# Patient Record
Sex: Female | Born: 1990
Health system: Southern US, Community
[De-identification: ages and names within clinical notes are randomized; demographics above are authoritative.]

## PROBLEM LIST (undated history)

## (undated) ENCOUNTER — Inpatient Hospital Stay (HOSPITAL_COMMUNITY): Payer: Self-pay

## (undated) DIAGNOSIS — F419 Anxiety disorder, unspecified: Secondary | ICD-10-CM

## (undated) DIAGNOSIS — Z789 Other specified health status: Secondary | ICD-10-CM

## (undated) DIAGNOSIS — M549 Dorsalgia, unspecified: Secondary | ICD-10-CM

## (undated) HISTORY — PX: CARPAL TUNNEL RELEASE: SHX101

---

## 2010-01-15 NOTE — L&D Delivery Note (Signed)
Patient was C/C/+1 and pushed for approx. 15 minutes without epidural.   NSVD  female infant, Apgars 8/9, weight 6#5.   The patient had minor labial lacerations, L side repaired with 2-0 vicryl. Fundus was firm. EBL was expected. Placenta was delivered intact. Vagina was clear.  Baby was vigorous to bedside.  Philip Aspen

## 2010-12-19 ENCOUNTER — Inpatient Hospital Stay (HOSPITAL_COMMUNITY)
Admission: EM | Admit: 2010-12-19 | Discharge: 2010-12-21 | DRG: 373 | Disposition: A | Discharge status: Home / Self Care | Payer: Federal, State, Local not specified - PPO | Attending: Obstetrics and Gynecology | Admitting: Obstetrics and Gynecology

## 2010-12-19 DIAGNOSIS — IMO0001 Reserved for inherently not codable concepts without codable children: Secondary | ICD-10-CM

## 2010-12-19 HISTORY — DX: Other specified health status: Z78.9

## 2010-12-19 LAB — RAPID HIV SCREEN (WH-MAU): Rapid HIV Screen: NONREACTIVE

## 2010-12-19 LAB — ABO/RH: ABO/RH(D): A POS

## 2010-12-19 LAB — CBC
HCT: 33.3 % — ABNORMAL LOW (ref 36.0–46.0)
Hemoglobin: 11.4 g/dL — ABNORMAL LOW (ref 12.0–15.0)
MCHC: 34.2 g/dL (ref 30.0–36.0)

## 2010-12-19 LAB — RUBELLA SCREEN: Rubella: >500 IU/mL — ABNORMAL HIGH

## 2010-12-19 LAB — TYPE AND SCREEN: ABO/RH(D): A POS

## 2010-12-19 MED ORDER — DIPHENHYDRAMINE HCL 25 MG PO CAPS: 25.0000 mg | ORAL_CAPSULE | Freq: Four times a day (QID) | ORAL | Status: DC | PRN

## 2010-12-19 MED ORDER — CALCIUM CARBONATE ANTACID 500 MG PO CHEW: 1.0000 | CHEWABLE_TABLET | Freq: Every day | ORAL | Status: DC | PRN

## 2010-12-19 MED ORDER — LANOLIN HYDROUS EX OINT: TOPICAL_OINTMENT | CUTANEOUS | Status: DC | PRN

## 2010-12-19 MED ORDER — ZOLPIDEM TARTRATE 5 MG PO TABS: 5.0000 mg | ORAL_TABLET | Freq: Every evening | ORAL | Status: DC | PRN

## 2010-12-19 MED ORDER — ACETAMINOPHEN 325 MG PO TABS: 650.0000 mg | ORAL_TABLET | ORAL | Status: DC | PRN

## 2010-12-19 MED ORDER — ONDANSETRON HCL 4 MG/2ML IJ SOLN
INTRAMUSCULAR | Status: AC
  Administered 2010-12-19: 05:00:00
  Filled 2010-12-19: qty 2

## 2010-12-19 MED ORDER — CITRIC ACID-SODIUM CITRATE 334-500 MG/5ML PO SOLN: 30.0000 mL | ORAL | Status: DC | PRN

## 2010-12-19 MED ORDER — DIPHENHYDRAMINE HCL 50 MG/ML IJ SOLN: 12.5000 mg | INTRAMUSCULAR | Status: DC | PRN

## 2010-12-19 MED ORDER — EPHEDRINE 5 MG/ML INJ: 10.0000 mg | INTRAVENOUS | Status: DC | PRN

## 2010-12-19 MED ORDER — ONDANSETRON HCL 4 MG PO TABS: 4.0000 mg | ORAL_TABLET | ORAL | Status: DC | PRN

## 2010-12-19 MED ORDER — OXYCODONE-ACETAMINOPHEN 5-325 MG PO TABS: 1.0000 | ORAL_TABLET | ORAL | Status: DC | PRN

## 2010-12-19 MED ORDER — ACETAMINOPHEN 325 MG PO TABS: 650.0000 mg | ORAL_TABLET | Freq: Four times a day (QID) | ORAL | Status: DC | PRN

## 2010-12-19 MED ORDER — PRENATAL PLUS 27-1 MG PO TABS
1.0000 | ORAL_TABLET | Freq: Every day | ORAL | Status: DC
  Filled 2010-12-19 (×2): qty 1

## 2010-12-19 MED ORDER — PHENYLEPHRINE 40 MCG/ML (10ML) SYRINGE FOR IV PUSH (FOR BLOOD PRESSURE SUPPORT): 80.0000 ug | PREFILLED_SYRINGE | INTRAVENOUS | Status: DC | PRN

## 2010-12-19 MED ORDER — SENNOSIDES-DOCUSATE SODIUM 8.6-50 MG PO TABS
2.0000 | ORAL_TABLET | Freq: Every day | ORAL | Status: DC
  Administered 2010-12-19: 2 via ORAL

## 2010-12-19 MED ORDER — WITCH HAZEL-GLYCERIN EX PADS: 1.0000 "application " | MEDICATED_PAD | CUTANEOUS | Status: DC | PRN

## 2010-12-19 MED ORDER — LIDOCAINE HCL (PF) 1 % IJ SOLN
30.0000 mL | INTRAMUSCULAR | Status: DC | PRN
  Administered 2010-12-19: 30 mL via SUBCUTANEOUS
  Filled 2010-12-19: qty 30

## 2010-12-19 MED ORDER — OXYTOCIN BOLUS FROM INFUSION
500.0000 mL | Freq: Once | INTRAVENOUS | Status: DC
  Filled 2010-12-19: qty 1000
  Filled 2010-12-19: qty 500

## 2010-12-19 MED ORDER — LACTATED RINGERS IV SOLN: 500.0000 mL | INTRAVENOUS | Status: DC | PRN

## 2010-12-19 MED ORDER — OXYCODONE-ACETAMINOPHEN 5-325 MG PO TABS
2.0000 | ORAL_TABLET | ORAL | Status: DC | PRN
  Administered 2010-12-19: 2 via ORAL
  Filled 2010-12-19: qty 2

## 2010-12-19 MED ORDER — IBUPROFEN 600 MG PO TABS: 600.0000 mg | ORAL_TABLET | Freq: Four times a day (QID) | ORAL | Status: DC | PRN

## 2010-12-19 MED ORDER — LACTATED RINGERS IV SOLN
INTRAVENOUS | Status: DC
  Administered 2010-12-19: 125 mL/h via INTRAVENOUS

## 2010-12-19 MED ORDER — TETANUS-DIPHTH-ACELL PERTUSSIS 5-2.5-18.5 LF-MCG/0.5 IM SUSP
0.5000 mL | Freq: Once | INTRAMUSCULAR | Status: DC
  Filled 2010-12-19: qty 0.5

## 2010-12-19 MED ORDER — PRENATAL PLUS 27-1 MG PO TABS
1.0000 | ORAL_TABLET | Freq: Every day | ORAL | Status: DC
  Administered 2010-12-19 – 2010-12-21 (×3): 1 via ORAL
  Filled 2010-12-19: qty 1

## 2010-12-19 MED ORDER — LACTATED RINGERS IV SOLN
500.0000 mL | Freq: Once | INTRAVENOUS | Status: AC
  Administered 2010-12-19: 500 mL via INTRAVENOUS

## 2010-12-19 MED ORDER — SIMETHICONE 80 MG PO CHEW: 80.0000 mg | CHEWABLE_TABLET | ORAL | Status: DC | PRN

## 2010-12-19 MED ORDER — ONDANSETRON HCL 4 MG/2ML IJ SOLN: 4.0000 mg | Freq: Four times a day (QID) | INTRAMUSCULAR | Status: DC | PRN

## 2010-12-19 MED ORDER — EPHEDRINE 5 MG/ML INJ
10.0000 mg | INTRAVENOUS | Status: DC | PRN
  Filled 2010-12-19: qty 4

## 2010-12-19 MED ORDER — PHENYLEPHRINE 40 MCG/ML (10ML) SYRINGE FOR IV PUSH (FOR BLOOD PRESSURE SUPPORT)
80.0000 ug | PREFILLED_SYRINGE | INTRAVENOUS | Status: DC | PRN
  Filled 2010-12-19: qty 5

## 2010-12-19 MED ORDER — DIBUCAINE 1 % RE OINT: 1.0000 "application " | TOPICAL_OINTMENT | RECTAL | Status: DC | PRN

## 2010-12-19 MED ORDER — IBUPROFEN 600 MG PO TABS
600.0000 mg | ORAL_TABLET | Freq: Four times a day (QID) | ORAL | Status: DC
  Administered 2010-12-19 – 2010-12-21 (×7): 600 mg via ORAL
  Filled 2010-12-19 (×8): qty 1

## 2010-12-19 MED ORDER — FLEET ENEMA 7-19 GM/118ML RE ENEM: 1.0000 | ENEMA | RECTAL | Status: DC | PRN

## 2010-12-19 MED ORDER — BENZOCAINE-MENTHOL 20-0.5 % EX AERO: 1.0000 "application " | INHALATION_SPRAY | CUTANEOUS | Status: DC | PRN

## 2010-12-19 MED ORDER — LACTATED RINGERS IV SOLN: INTRAVENOUS | Status: DC

## 2010-12-19 MED ORDER — FENTANYL 2.5 MCG/ML BUPIVACAINE 1/10 % EPIDURAL INFUSION (WH - ANES)
14.0000 mL/h | INTRAMUSCULAR | Status: DC
  Filled 2010-12-19: qty 60

## 2010-12-19 MED ORDER — OXYTOCIN 20 UNITS IN LACTATED RINGERS INFUSION - SIMPLE
125.0000 mL/h | Freq: Once | INTRAVENOUS | Status: AC
  Administered 2010-12-19: 999 mL/h via INTRAVENOUS

## 2010-12-19 MED ORDER — ONDANSETRON HCL 4 MG/2ML IJ SOLN: 4.0000 mg | INTRAMUSCULAR | Status: DC | PRN

## 2010-12-19 NOTE — ED Notes (Signed)
Pt from home with c/o labor. Pt states that she thought she was in false labor until about 30 min ago when she went to the bathroom and noticed she was bleeding. This is pts first pregnancy. Pt states contractions started in her back and have not moved to her abdomen. Pt is unable to give frequency. FHT 145.

## 2010-12-19 NOTE — Progress Notes (Signed)
PSYCHOSOCIAL ASSESSMENT ~ MATERNAL/CHILD Name: Susan Stanton                                                                         Age: 20  Referral Date:       30 /  04 / 12  Reason/Source: NPNC / CN  I. FAMILY/HOME ENVIRONMENT A. Child's Legal Guardian _X__Parent(s) ___Grandparent ___Foster parent ___DSS_________________ Name: Jones Skene                               DOB: //                     Age: 38  Address: [address] ; [place], [place] [address]  Name:   Mikeal Hawthorne                              DOB: //                     Age: 78  Address: ( same as above)  B. Other Household Members/Support Persons Name:                                         Relationship: grandparents   DOB ___/___/___                   Name:                                         Relationship:                        DOB ___/___/___                   Name:                                         Relationship:                        DOB ___/___/___                   Name:                                         Relationship:                        DOB ___/___/___  C. Other Support:   II. PSYCHOSOCIAL DATA A. Information Source  _X_Patient Interview  __Family Interview           __Other___________  B. Event organiser __Employment: __Medicaid    County:                 _X_Private Insurance:  BCBS                 __Self Pay  _X_Food Stamps   __WIC*plan to apply __Work First     __Public Housing     __Section 8    __Maternity Care Coordination/Child Service Coordination/Early Intervention   ___School:                                                                         Grade:  __Other:   Salena Saner Cultural and Environment Information Cultural Issues Impacting Care:  III. STRENGTHS _X__Supportive family/friends _X__Adequate Resources ___Compliance with medical plan _X__Home prepared  for Child (including basic supplies) ___Understanding of illness      ___Other: RISK FACTORS AND CURRENT PROBLEMS         ____No Problems Noted        NPNC                                                                                                                                                                                                                                             IV. SOCIAL WORK ASSESSMENT  Sw met with pt to assess reason for [facility].  Pt told Sw that she started [facility] in 3rd month of pregnancy.  According to the pt, she kept all of her appointments until she and FOB relocated to the area, 2 weeks ago.  Pt and FOB are currently living with her grandparents.  She denies any illegal substance use and verbalized understanding of hospital drug testing policy.  UDS and meconium results are pending.  She reports having all the necessary supplies for the infant.  Pt has family at the bedside for support.  She plans to apply for [facility] and Medicaid for the infant upon discharge.  Sw observed pt bonding with the infant  during assessment.  Sw will follow up with drug screen results and make a referral if needed.      V. SOCIAL WORK PLAN  __X_No Further Intervention Required/No Barriers to Discharge   ___Psychosocial Support and Ongoing Assessment of Needs   ___Patient/Family Education:   ___Child Protective Services Report   County___________ Date___/____/____   ___Information/Referral to [employer]_________________________   ___Other:

## 2010-12-19 NOTE — ED Provider Notes (Signed)
History     CSN: [id number] Arrival date & time: 12/19/2010  4:13 AM   First MD Initiated Contact with Patient 12/19/10 0424      Chief Complaint  Patient presents with  . Laboring    (Consider location/radiation/quality/duration/timing/severity/associated sxs/prior treatment) Patient is a 20 y.o. female presenting with abdominal pain. The history is provided by the patient and the spouse.  Abdominal Pain The primary symptoms of the illness include abdominal pain. The primary symptoms of the illness do not include fever, shortness of breath, vomiting or dysuria. Primary symptoms comment: labor The current episode started less than 1 hour ago. The onset of the illness was gradual. The problem has been gradually worsening.  Associated with: 37 weeks preg and water broke about 30 min PTA. The patient states that she believes she is currently pregnant. The patient has not had a change in bowel habit. Risk factors: no problems with pregnancy. Symptoms associated with the illness do not include chills. Significant associated medical issues do not include diabetes or gallstones.   G1 P0 and follow closely in [facility] by OB/GYN, was just checked a few days ago and told she was 5 cm dilated. No problems with pregnancy. Having contractions every 5-10 minutes. Presents here with labor pains and water has broken.  No past medical history on file.  No past surgical history on file.  No family history on file.  History  Substance Use Topics  . Smoking status: Not on file  . Smokeless tobacco: Not on file  . Alcohol Use: Not on file    OB History    No data available      Review of Systems  Constitutional: Negative for fever and chills.  HENT: Negative for neck pain and neck stiffness.   Eyes: Negative for pain.  Respiratory: Negative for shortness of breath.   Cardiovascular: Negative for chest pain.  Gastrointestinal: Positive for abdominal pain. Negative for vomiting.    Genitourinary: Negative for dysuria.  Musculoskeletal: Negative for joint swelling.  Skin: Negative for rash.  Neurological: Negative for headaches.  All other systems reviewed and are negative.    Allergies  Review of patient's allergies indicates not on file.  Home Medications  No current outpatient prescriptions on file.  There were no vitals taken for this visit.  Physical Exam  Constitutional: She is oriented to person, place, and time. She appears well-developed and well-nourished.  HENT:  Head: Normocephalic and atraumatic.  Eyes: Conjunctivae and EOM are normal. Pupils are equal, round, and reactive to light.  Neck: Trachea normal. Neck supple. No thyromegaly present.  Cardiovascular: Normal rate, regular rhythm, S1 normal, S2 normal and normal pulses.     No systolic murmur is present   No diastolic murmur is present  Pulses:      Radial pulses are 2+ on the right side, and 2+ on the left side.  Pulmonary/Chest: Effort normal and breath sounds normal. She has no wheezes. She has no rhonchi. She has no rales. She exhibits no tenderness.  Abdominal: Soft. Normal appearance and bowel sounds are normal. There is no CVA tenderness and negative Murphy's sign.       Gravid, no point tenderness  Genitourinary:       Dilated approximately 6 cm  Musculoskeletal:       BLE:s Calves nontender, no cords or erythema, negative Homans sign  Neurological: She is alert and oriented to person, place, and time. She has normal strength. No cranial nerve deficit or sensory  deficit. GCS eye subscore is 4. GCS verbal subscore is 5. GCS motor subscore is 6.  Skin: Skin is warm and dry. No rash noted. She is not diaphoretic.  Psychiatric: Her speech is normal.       Cooperative and appropriate    ED Course  Procedures (including critical care time)  OB rapid response call arrival and evaluated patient. Case discussed as above with Dr. Claiborne Billings who accepts patient in transfer [facility] at this time. On monitor fetal heart rate is 150s.    MDM   G1 P0 in labor. Assessment by rapid response OB nurse from [facility] and patient transferred to women's, accepted by Dr. Claiborne Billings as above.         Sunnie Nielsen, MD 12/19/10 [phone]

## 2010-12-19 NOTE — H&P (Signed)
20 y.o. @37 .4  G1P0 comes in as unassigned transfer from [facility] with ctx found to be dilated 7cm.  Otherwise has good fetal movement and no bleeding.  Past Medical History  Diagnosis Date  . No pertinent past medical history     Past Surgical History  Procedure Date  . No past surgeries     OB History    Grav Para Term Preterm Abortions TAB SAB Ect Mult Living   1              # Outc Date GA Lbr Len/2nd Wgt Sex Del Anes PTL Lv   1 CUR               History   Social History  . Marital Status: Married    Spouse Name: N/A    Number of Children: N/A  . Years of Education: N/A   Occupational History  . Not on file.   Social History Main Topics  . Smoking status: Never Smoker   . Smokeless tobacco: Not on file  . Alcohol Use: No  . Drug Use: No  . Sexually Active:    Other Topics Concern  . Not on file   Social History Narrative  . No narrative on file   Review of patient's allergies indicates no known allergies.   Prenatal Course:  Unremarkable per patient.  Prenatal records pending from [facility].  Filed Vitals:   12/19/10 0512  BP: 106/89  Pulse: 113  Temp: 97.8 F (36.6 C)  Resp: 20     Lungs/Cor:  NAD Abdomen:  soft, gravid Ex:  no cords, erythema SVE:  9 FHTs:  130, good STV, NST R Toco:  q 2   A/P   37.4 in labor  GBS unknown Epidural desired IV/IVF Ctoco/efm Clear liquids  Laya Letendre

## 2010-12-19 NOTE — Progress Notes (Signed)
UR chart review completed.  

## 2010-12-19 NOTE — Progress Notes (Signed)
NSVD of a viable female.  

## 2010-12-20 LAB — CBC
HCT: 31.4 % — ABNORMAL LOW (ref 36.0–46.0)
Platelets: 136 10*3/uL — ABNORMAL LOW (ref 150–400)
RBC: 3.5 MIL/uL — ABNORMAL LOW (ref 3.87–5.11)
RDW: 13.4 % (ref 11.5–15.5)
WBC: 11.7 10*3/uL — ABNORMAL HIGH (ref 4.0–10.5)

## 2010-12-20 NOTE — Progress Notes (Signed)
Post Partum Day 1 Subjective: no complaints  Objective: Blood pressure 90/52, pulse 77, temperature 98.1 F (36.7 C), temperature source Oral, resp. rate 18, height 5\' 6"  (1.676 m), weight 77.565 kg (171 lb), last menstrual period 03/31/2010, SpO2 97.00%, unknown if currently breastfeeding.  Physical Exam:  General: alert Lochia: appropriate Uterine Fundus: firm   Basename 12/20/10 0525 12/19/10 0524  HGB 10.5* 11.4*  HCT 31.4* 33.3*    Assessment/Plan: Plan for discharge tomorrow   LOS: 1 day   Rolan Wrightsman D 12/20/2010, 10:03 AM

## 2010-12-21 MED ORDER — INFLUENZA VIRUS VACC SPLIT PF IM SUSP: 0.5000 mL | Freq: Once | INTRAMUSCULAR | Status: DC

## 2010-12-21 MED ORDER — INFLUENZA VIRUS VACC SPLIT PF IM SUSP
0.5000 mL | INTRAMUSCULAR | Status: AC | PRN
  Administered 2010-12-21: 0.5 mL via INTRAMUSCULAR
  Filled 2010-12-21: qty 0.5

## 2010-12-21 NOTE — Discharge Summary (Signed)
Obstetric Discharge Summary Reason for Admission: onset of labor Prenatal Procedures: none Intrapartum Procedures: spontaneous vaginal delivery Postpartum Procedures: none Complications-Operative and Postpartum: 1st degree labial  Hemoglobin  Date Value Range Status  12/20/2010 10.5* 12.0-15.0 (g/dL) Final     HCT  Date Value Range Status  12/20/2010 31.4* 36.0-46.0 (%) Final    Discharge Diagnoses: Term Pregnancy-delivered  Discharge Information: Date: 12/21/2010 Activity: pelvic rest Diet: routine Medications: Ibuprofen Condition: stable Instructions: refer to practice specific booklet Discharge to: home Follow-up Information    Follow up with CALLAHAN, SIDNEY, DO in 4 weeks.   Contact information:   [address], [address] [facility] [place] [address] [phone]          Newborn Data: Live born female  Birth Weight: 6 lb 5 oz (2863 g) APGAR: 8, 9  Home with mother.  Susan Stanton A 12/21/2010, 7:43 AM

## 2010-12-21 NOTE — Progress Notes (Signed)
Patient is eating, ambulating, voiding.  Pain control is good.  Filed Vitals:   12/20/10 0540 12/20/10 1353 12/20/10 2159 12/21/10 0634  BP: 90/52 118/76 95/60 98/66   Pulse: 77 74 73 75  Temp: 98.1 F (36.7 C) 98.5 F (36.9 C) 97.7 F (36.5 C) 97.9 F (36.6 C)  TempSrc: Oral Oral Oral Oral  Resp: 18 18 18 18   Height:      Weight:      SpO2:        Fundus firm Perineum without swelling.  Lab Results  Component Value Date   WBC 11.7* 12/20/2010   HGB 10.5* 12/20/2010   HCT 31.4* 12/20/2010   MCV 89.7 12/20/2010   PLT 136* 12/20/2010    --/--/A POS, A POS (12/04 1030)/RI  A/P Post partum day 2.  Routine care.  Expect d/c today.    Luisa Louk A

## 2011-06-25 ENCOUNTER — Emergency Department (HOSPITAL_COMMUNITY)
Admission: EM | Admit: 2011-06-25 | Discharge: 2011-06-25 | Disposition: A | Discharge status: Home / Self Care | Payer: Federal, State, Local not specified - PPO | Attending: Emergency Medicine | Admitting: Emergency Medicine

## 2011-06-25 ENCOUNTER — Emergency Department (HOSPITAL_COMMUNITY): Payer: Federal, State, Local not specified - PPO

## 2011-06-25 ENCOUNTER — Encounter (HOSPITAL_COMMUNITY): Payer: Self-pay

## 2011-06-25 DIAGNOSIS — R112 Nausea with vomiting, unspecified: Secondary | ICD-10-CM | POA: Insufficient documentation

## 2011-06-25 DIAGNOSIS — S52539A Colles' fracture of unspecified radius, initial encounter for closed fracture: Secondary | ICD-10-CM | POA: Insufficient documentation

## 2011-06-25 DIAGNOSIS — M25539 Pain in unspecified wrist: Secondary | ICD-10-CM | POA: Insufficient documentation

## 2011-06-25 DIAGNOSIS — S52501A Unspecified fracture of the lower end of right radius, initial encounter for closed fracture: Secondary | ICD-10-CM

## 2011-06-25 MED ORDER — ONDANSETRON HCL 4 MG/2ML IJ SOLN
INTRAMUSCULAR | Status: AC
  Administered 2011-06-25: 4 mg via INTRAVENOUS
  Filled 2011-06-25: qty 2

## 2011-06-25 MED ORDER — ONDANSETRON HCL 4 MG/2ML IJ SOLN
INTRAMUSCULAR | Status: AC
  Administered 2011-06-25: 14:00:00
  Filled 2011-06-25: qty 2

## 2011-06-25 MED ORDER — ONDANSETRON 4 MG PO TBDP
4.0000 mg | ORAL_TABLET | Freq: Once | ORAL | Status: AC
Start: 2011-06-25 — End: 2011-06-25
  Administered 2011-06-25: 4 mg via ORAL

## 2011-06-25 MED ORDER — HYDROMORPHONE HCL PF 1 MG/ML IJ SOLN
1.0000 mg | Freq: Once | INTRAMUSCULAR | Status: AC
  Administered 2011-06-25: 1 mg via INTRAVENOUS
  Filled 2011-06-25: qty 1

## 2011-06-25 MED ORDER — OXYCODONE-ACETAMINOPHEN 5-325 MG PO TABS
ORAL_TABLET | ORAL | Status: AC
  Filled 2011-06-25: qty 1

## 2011-06-25 MED ORDER — OXYCODONE-ACETAMINOPHEN 5-325 MG PO TABS
1.0000 | ORAL_TABLET | Freq: Once | ORAL | Status: AC
  Administered 2011-06-25: 1 via ORAL

## 2011-06-25 MED ORDER — OXYCODONE-ACETAMINOPHEN 5-325 MG PO TABS
1.0000 | ORAL_TABLET | Freq: Once | ORAL | Status: AC
  Administered 2011-06-25: 1 via ORAL
  Filled 2011-06-25: qty 1

## 2011-06-25 MED ORDER — ONDANSETRON HCL 4 MG/2ML IJ SOLN
4.0000 mg | Freq: Once | INTRAMUSCULAR | Status: AC
  Administered 2011-06-25: 4 mg via INTRAVENOUS

## 2011-06-25 MED ORDER — OXYCODONE-ACETAMINOPHEN 5-325 MG PO TABS: 1.0000 | ORAL_TABLET | Freq: Four times a day (QID) | ORAL | Status: DC | PRN

## 2011-06-25 MED ORDER — DIAZEPAM 5 MG PO TABS: 5.0000 mg | ORAL_TABLET | Freq: Three times a day (TID) | ORAL | Status: DC | PRN

## 2011-06-25 MED ORDER — ONDANSETRON 4 MG PO TBDP
ORAL_TABLET | ORAL | Status: AC
  Filled 2011-06-25: qty 1
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
## 2011-06-25 NOTE — ED Notes (Signed)
Pt waiting for hand surgeon

## 2011-06-25 NOTE — Progress Notes (Signed)
Orthopedic Tech Progress Note Patient Details:  Susan Stanton [birth year] [id number]  Ortho Devices Type of Ortho Device: Arm foam sling;Sugartong splint Ortho Device/Splint Location: (R) ue Ortho Device/Splint Interventions: Application   Jennye Moccasin 06/25/2011, 6:21 PM
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
## 2011-06-26 NOTE — ED Provider Notes (Signed)
Medical screening examination/treatment/procedure(s) were performed by non-physician practitioner and as supervising physician I was immediately available for consultation/collaboration.   Gwyneth Sprout, MD 06/26/11 2158

## 2011-06-26 NOTE — ED Provider Notes (Signed)
Medical screening examination/treatment/procedure(s) were performed by non-physician practitioner and as supervising physician I was immediately available for consultation/collaboration.   Gwyneth Sprout, MD 06/26/11 2202

## 2011-06-29 ENCOUNTER — Other Ambulatory Visit: Payer: Self-pay | Admitting: Orthopedic Surgery

## 2011-06-29 ENCOUNTER — Encounter (HOSPITAL_COMMUNITY): Payer: Self-pay | Admitting: *Deleted

## 2011-06-29 ENCOUNTER — Encounter (HOSPITAL_COMMUNITY): Payer: Self-pay | Admitting: Pharmacy Technician

## 2011-07-01 MED ORDER — CEFAZOLIN SODIUM 1-5 GM-% IV SOLN
1.0000 g | INTRAVENOUS | Status: AC
  Administered 2011-07-02: 1 g via INTRAVENOUS
  Filled 2011-07-01 (×2): qty 50

## 2011-07-02 ENCOUNTER — Encounter (HOSPITAL_COMMUNITY): Payer: Self-pay | Admitting: Anesthesiology

## 2011-07-02 ENCOUNTER — Ambulatory Visit (HOSPITAL_COMMUNITY)
Admission: RE | Admit: 2011-07-02 | Discharge: 2011-07-02 | Disposition: A | Discharge status: Home / Self Care | Payer: Federal, State, Local not specified - PPO | Source: Ambulatory Visit | Attending: Orthopedic Surgery | Admitting: Orthopedic Surgery

## 2011-07-02 ENCOUNTER — Ambulatory Visit (HOSPITAL_COMMUNITY): Payer: Federal, State, Local not specified - PPO | Admitting: Anesthesiology

## 2011-07-02 ENCOUNTER — Encounter (HOSPITAL_COMMUNITY)
Admission: RE | Disposition: A | Discharge status: Home / Self Care | Payer: Self-pay | Source: Ambulatory Visit | Attending: Orthopedic Surgery

## 2011-07-02 ENCOUNTER — Encounter (HOSPITAL_COMMUNITY): Payer: Self-pay | Admitting: *Deleted

## 2011-07-02 DIAGNOSIS — S52509A Unspecified fracture of the lower end of unspecified radius, initial encounter for closed fracture: Secondary | ICD-10-CM

## 2011-07-02 DIAGNOSIS — S52599A Other fractures of lower end of unspecified radius, initial encounter for closed fracture: Secondary | ICD-10-CM | POA: Insufficient documentation

## 2011-07-02 HISTORY — DX: Dorsalgia, unspecified: M54.9

## 2011-07-02 HISTORY — PX: CARPAL TUNNEL RELEASE: SHX101

## 2011-07-02 HISTORY — DX: Anxiety disorder, unspecified: F41.9

## 2011-07-02 LAB — CBC
MCHC: 35.4 g/dL (ref 30.0–36.0)
RDW: 12.7 % (ref 11.5–15.5)

## 2011-07-02 LAB — HCG, SERUM, QUALITATIVE: Preg, Serum: NEGATIVE

## 2011-07-02 LAB — SURGICAL PCR SCREEN
MRSA, PCR: NEGATIVE
Staphylococcus aureus: NEGATIVE

## 2011-07-02 SURGERY — OPEN REDUCTION INTERNAL FIXATION (ORIF) DISTAL RADIUS FRACTURE
Anesthesia: General | Site: Hand | Laterality: Right | Wound class: Clean

## 2011-07-02 MED ORDER — HYDROMORPHONE HCL PF 1 MG/ML IJ SOLN
INTRAMUSCULAR | Status: AC
  Filled 2011-07-02: qty 1

## 2011-07-02 MED ORDER — BUPIVACAINE HCL (PF) 0.25 % IJ SOLN
INTRAMUSCULAR | Status: DC | PRN
  Administered 2011-07-02: 5 mL

## 2011-07-02 MED ORDER — MUPIROCIN 2 % EX OINT
TOPICAL_OINTMENT | Freq: Two times a day (BID) | CUTANEOUS | Status: DC
  Administered 2011-07-02: 09:00:00 via NASAL
  Filled 2011-07-02: qty 22

## 2011-07-02 MED ORDER — ONDANSETRON HCL 4 MG/2ML IJ SOLN
INTRAMUSCULAR | Status: AC
  Filled 2011-07-02: qty 2

## 2011-07-02 MED ORDER — LACTATED RINGERS IV SOLN
INTRAVENOUS | Status: DC
  Administered 2011-07-02: 11:00:00 via INTRAVENOUS

## 2011-07-02 MED ORDER — ONDANSETRON HCL 4 MG/2ML IJ SOLN
4.0000 mg | Freq: Once | INTRAMUSCULAR | Status: AC
  Administered 2011-07-02: 4 mg via INTRAVENOUS

## 2011-07-02 MED ORDER — FENTANYL CITRATE 0.05 MG/ML IJ SOLN
INTRAMUSCULAR | Status: DC | PRN
  Administered 2011-07-02: 100 ug via INTRAVENOUS
  Administered 2011-07-02: 50 ug via INTRAVENOUS

## 2011-07-02 MED ORDER — MUPIROCIN 2 % EX OINT
TOPICAL_OINTMENT | CUTANEOUS | Status: AC
  Filled 2011-07-02: qty 22

## 2011-07-02 MED ORDER — MIDAZOLAM HCL 5 MG/5ML IJ SOLN
INTRAMUSCULAR | Status: DC | PRN
  Administered 2011-07-02: 2 mg via INTRAVENOUS

## 2011-07-02 MED ORDER — HYDROMORPHONE HCL PF 1 MG/ML IJ SOLN
0.2500 mg | INTRAMUSCULAR | Status: DC | PRN
  Administered 2011-07-02 (×3): 0.5 mg via INTRAVENOUS

## 2011-07-02 MED ORDER — 0.9 % SODIUM CHLORIDE (POUR BTL) OPTIME
TOPICAL | Status: DC | PRN
  Administered 2011-07-02: 60 mL

## 2011-07-02 MED ORDER — PHENYLEPHRINE HCL 10 MG/ML IJ SOLN
INTRAMUSCULAR | Status: DC | PRN
  Administered 2011-07-02 (×2): 80 ug via INTRAVENOUS

## 2011-07-02 MED ORDER — ONDANSETRON HCL 4 MG/2ML IJ SOLN
INTRAMUSCULAR | Status: DC | PRN
  Administered 2011-07-02: 4 mg via INTRAVENOUS

## 2011-07-02 MED ORDER — CHLORHEXIDINE GLUCONATE 4 % EX LIQD: 60.0000 mL | Freq: Once | CUTANEOUS | Status: DC

## 2011-07-02 MED ORDER — LIDOCAINE HCL (CARDIAC) 20 MG/ML IV SOLN
INTRAVENOUS | Status: DC | PRN
  Administered 2011-07-02: 55 mg via INTRAVENOUS

## 2011-07-02 MED ORDER — OXYCODONE-ACETAMINOPHEN 5-325 MG PO TABS: 1.0000 | ORAL_TABLET | ORAL | Status: AC | PRN

## 2011-07-02 MED ORDER — LACTATED RINGERS IV SOLN
INTRAVENOUS | Status: DC | PRN
  Administered 2011-07-02: 11:00:00 via INTRAVENOUS

## 2011-07-02 MED ORDER — PROPOFOL 10 MG/ML IV BOLUS
INTRAVENOUS | Status: DC | PRN
  Administered 2011-07-02: 200 mg via INTRAVENOUS

## 2011-07-02 SURGICAL SUPPLY — 53 items
BANDAGE ELASTIC 3 VELCRO ST LF (GAUZE/BANDAGES/DRESSINGS) ×2 IMPLANT
BANDAGE ELASTIC 4 VELCRO ST LF (GAUZE/BANDAGES/DRESSINGS) ×2 IMPLANT
BANDAGE GAUZE ELAST BULKY 4 IN (GAUZE/BANDAGES/DRESSINGS) ×2 IMPLANT
BIT DRILL 2.5X4 QC (BIT) ×4 IMPLANT
BNDG CMPR 9X4 STRL LF SNTH (GAUZE/BANDAGES/DRESSINGS) ×1
BNDG ESMARK 4X9 LF (GAUZE/BANDAGES/DRESSINGS) ×2 IMPLANT
CLOTH BEACON ORANGE TIMEOUT ST (SAFETY) ×2 IMPLANT
CORDS BIPOLAR (ELECTRODE) ×2 IMPLANT
COVER MAYO STAND STRL (DRAPES) IMPLANT
COVER SURGICAL LIGHT HANDLE (MISCELLANEOUS) ×2 IMPLANT
CUFF TOURNIQUET SINGLE 18IN (TOURNIQUET CUFF) IMPLANT
CUFF TOURNIQUET SINGLE 24IN (TOURNIQUET CUFF) IMPLANT
DRAPE SURG 17X23 STRL (DRAPES) ×2 IMPLANT
DURAPREP 26ML APPLICATOR (WOUND CARE) ×2 IMPLANT
GAUZE XEROFORM 1X8 LF (GAUZE/BANDAGES/DRESSINGS) ×2 IMPLANT
GLOVE BIO SURGEON STRL SZ8.5 (GLOVE) ×2 IMPLANT
GOWN PREVENTION PLUS XXLARGE (GOWN DISPOSABLE) ×2 IMPLANT
GOWN SRG XL XLNG 56XLVL 4 (GOWN DISPOSABLE) ×1 IMPLANT
GOWN STRL NON-REIN LRG LVL3 (GOWN DISPOSABLE) ×2 IMPLANT
GOWN STRL NON-REIN XL XLG LVL4 (GOWN DISPOSABLE) ×2
KIT BASIN OR (CUSTOM PROCEDURE TRAY) ×2 IMPLANT
KIT ROOM TURNOVER OR (KITS) ×2 IMPLANT
NEEDLE 22X1 1/2 (OR ONLY) (NEEDLE) IMPLANT
NEEDLE HYPO 25GX1X1/2 BEV (NEEDLE) IMPLANT
NS IRRIG 1000ML POUR BTL (IV SOLUTION) ×2 IMPLANT
PACK ORTHO EXTREMITY (CUSTOM PROCEDURE TRAY) ×2 IMPLANT
PAD ARMBOARD 7.5X6 YLW CONV (MISCELLANEOUS) ×4 IMPLANT
PAD CAST 4YDX4 CTTN HI CHSV (CAST SUPPLIES) ×1 IMPLANT
PADDING CAST ABS 4INX4YD NS (CAST SUPPLIES) ×1
PADDING CAST ABS COTTON 4X4 ST (CAST SUPPLIES) ×1 IMPLANT
PADDING CAST COTTON 4X4 STRL (CAST SUPPLIES) ×2
PEG THREADED 2.5MMX20MM LONG (Peg) ×2 IMPLANT
PEG THREADED 2.5MMX22MM LONG (Peg) ×2 IMPLANT
PEG THREADED 2.5MMX24MM LONG (Peg) ×2 IMPLANT
PLATE STAN 24.4X59.5 RT (Plate) ×2 IMPLANT
SCREW BN 12X3.5XNS CORT TI (Screw) ×2 IMPLANT
SCREW CORT 3.5X12 (Screw) ×4 IMPLANT
SCREW CORT 3.5X14 LNG (Screw) ×2 IMPLANT
SCREW PEG 2.5X22 NONLOCK (Screw) ×4 IMPLANT
SCREW PEG 2.5X24 NONLOCK (Screw) ×2 IMPLANT
SPONGE GAUZE 4X4 12PLY (GAUZE/BANDAGES/DRESSINGS) ×2 IMPLANT
STRIP CLOSURE SKIN 1/2X4 (GAUZE/BANDAGES/DRESSINGS) ×2 IMPLANT
SUT ETHILON 4 0 PS 2 18 (SUTURE) IMPLANT
SUT PROLENE 3 0 PS 2 (SUTURE) IMPLANT
SUT VIC AB 2-0 FS1 27 (SUTURE) ×2 IMPLANT
SUT VIC AB 3-0 PS2 18 (SUTURE)
SUT VIC AB 3-0 PS2 18XBRD (SUTURE) IMPLANT
SUT VICRYL RAPIDE 4/0 PS 2 (SUTURE) ×2 IMPLANT
SYR CONTROL 10ML LL (SYRINGE) IMPLANT
TOWEL OR 17X24 6PK STRL BLUE (TOWEL DISPOSABLE) ×2 IMPLANT
TOWEL OR 17X26 10 PK STRL BLUE (TOWEL DISPOSABLE) ×2 IMPLANT
UNDERPAD 30X30 INCONTINENT (UNDERPADS AND DIAPERS) ×2 IMPLANT
WATER STERILE IRR 1000ML POUR (IV SOLUTION) ×2 IMPLANT

## 2011-07-02 NOTE — Transfer of Care (Signed)
Immediate Anesthesia Transfer of Care Note  Patient: Susan Stanton  Procedure(s) Performed: Procedure(s) (LRB): OPEN REDUCTION INTERNAL FIXATION (ORIF) DISTAL RADIAL FRACTURE (Right) CARPAL TUNNEL RELEASE (Right)  Patient Location: PACU  Anesthesia Type: General  Level of Consciousness: awake, alert  and oriented  Airway & Oxygen Therapy: Patient Spontanous Breathing and Patient connected to nasal cannula oxygen  Post-op Assessment: Report given to PACU RN, Post -op Vital signs reviewed and stable and Patient moving all extremities  Post vital signs: Reviewed and stable  Complications: No apparent anesthesia complications

## 2011-07-02 NOTE — Op Note (Signed)
See dictated note [phone]

## 2011-07-02 NOTE — Anesthesia Postprocedure Evaluation (Signed)
  Anesthesia Post-op Note  Patient: Radiation protection practitioner  Procedure(s) Performed: Procedure(s) (LRB): OPEN REDUCTION INTERNAL FIXATION (ORIF) DISTAL RADIAL FRACTURE (Right) CARPAL TUNNEL RELEASE (Right)  Patient Location: PACU  Anesthesia Type: General  Level of Consciousness: awake  Airway and Oxygen Therapy: Patient Spontanous Breathing  Post-op Pain: mild  Post-op Assessment: Post-op Vital signs reviewed  Post-op Vital Signs: Reviewed  Complications: No apparent anesthesia complications

## 2011-07-02 NOTE — Anesthesia Preprocedure Evaluation (Addendum)
Anesthesia Evaluation  Patient identified by MRN, date of birth, ID band Patient awake    Reviewed: Allergy & Precautions, H&P , NPO status , Patient's Chart, lab work & pertinent test results  Airway Mallampati: II      Dental   Pulmonary neg pulmonary ROS,  breath sounds clear to auscultation        Cardiovascular negative cardio ROS  Rhythm:Regular Rate:Normal     Neuro/Psych    GI/Hepatic negative GI ROS, Neg liver ROS,   Endo/Other  negative endocrine ROS  Renal/GU negative Renal ROS     Musculoskeletal   Abdominal   Peds  Hematology negative hematology ROS (+)   Anesthesia Other Findings   Reproductive/Obstetrics                           Anesthesia Physical Anesthesia Plan  ASA: I  Anesthesia Plan: General   Post-op Pain Management:    Induction: Intravenous  Airway Management Planned: LMA  Additional Equipment:   Intra-op Plan:   Post-operative Plan: Extubation in OR  Informed Consent:   Plan Discussed with: CRNA  Anesthesia Plan Comments:        Anesthesia Quick Evaluation

## 2011-07-02 NOTE — Preoperative (Signed)
Beta Blockers   Reason not to administer Beta Blockers:Not Applicable 

## 2011-07-02 NOTE — Brief Op Note (Signed)
07/02/2011  12:35 PM  PATIENT:  Susan Stanton  21 y.o. female  PRE-OPERATIVE DIAGNOSIS:  RIGHT DISTAL RADIUS FRACTURE  POST-OPERATIVE DIAGNOSIS:  RIGHT DISTAL RADIUS FRACTURE  PROCEDURE:  Procedure(s) (LRB): OPEN REDUCTION INTERNAL FIXATION (ORIF) DISTAL RADIAL FRACTURE (Right) CARPAL TUNNEL RELEASE (Right)  SURGEON:  Surgeon(s) and Role:    * Marlowe Shores, MD - Primary  PHYSICIAN ASSISTANT:   ASSISTANTS: none   ANESTHESIA:   general  EBL:     BLOOD ADMINISTERED:none  DRAINS: none   LOCAL MEDICATIONS USED:  MARCAINE   10cc  SPECIMEN:  No Specimen  DISPOSITION OF SPECIMEN:  N/A  COUNTS:  YES  TOURNIQUET:   Total Tourniquet Time Documented: Upper Arm (Right) - 51 minutes  DICTATION: .Other Dictation: Dictation Number [id number]  PLAN OF CARE: Discharge to home after PACU  PATIENT DISPOSITION:  PACU - hemodynamically stable.   Delay start of Pharmacological VTE agent (>24hrs) due to surgical blood loss or risk of bleeding: not applicable

## 2011-07-02 NOTE — Anesthesia Procedure Notes (Signed)
Procedure Name: LMA Insertion Date/Time: 07/02/2011 11:26 AM Performed by: Sharlene Dory E Pre-anesthesia Checklist: Patient identified, Timeout performed, Emergency Drugs available, Suction available and Patient being monitored Patient Re-evaluated:Patient Re-evaluated prior to inductionOxygen Delivery Method: Circle system utilized Preoxygenation: Pre-oxygenation with 100% oxygen Intubation Type: IV induction LMA: LMA inserted LMA Size: 4.0 Number of attempts: 1 Placement Confirmation: positive ETCO2 and breath sounds checked- equal and bilateral Tube secured with: Tape Dental Injury: Teeth and Oropharynx as per pre-operative assessment

## 2011-07-02 NOTE — Interval H&P Note (Signed)
History and Physical Interval Note:  07/02/2011 11:04 AM  Susan Stanton  has presented today for surgery, with the diagnosis of RIGHT DISTAL RADIUS FRACTURE  The various methods of treatment have been discussed with the patient and family. After consideration of risks, benefits and other options for treatment, the patient has consented to  Procedure(s) (LRB): OPEN REDUCTION INTERNAL FIXATION (ORIF) DISTAL RADIAL FRACTURE (Right) CARPAL TUNNEL RELEASE (Right) as a surgical intervention .  The patients' history has been reviewed, patient examined, no change in status, stable for surgery.  I have reviewed the patients' chart and labs.  Questions were answered to the patient's satisfaction.     Dairl Ponder A

## 2011-07-02 NOTE — Op Note (Signed)
NAMETEMPRENCE, RHINES             ACCOUNT NO.:  [id number]  MEDICAL RECORD NO.:  [id number]  LOCATION:  [facility]                         FACILITY:  [facility]  PHYSICIAN:  Artist Pais. Syrianna Schillaci, M.D.DATE OF BIRTH:  [birth year]  DATE OF PROCEDURE:  07/02/2011 DATE OF DISCHARGE:  07/02/2011                              OPERATIVE REPORT   PREOPERATIVE DIAGNOSIS:  Displaced fracture distal radius, right side.  POSTOPERATIVE DIAGNOSIS:  Displaced fracture distal radius, right side.  PROCEDURE:  Open reduction internal fixation above with DVR plates and screws and carpal tunnel release.  SURGEON:  Artist Pais. Mina Marble, M.D.  ASSISTANT:  None.  ANESTHESIA:  General.  TOURNIQUET TIME:  46 minutes.  COMPLICATION:  No complication.  DRAINS:  No drains.  The patient was taken to the operating suite.  After induction of general anesthesia, right upper extremity was prepped and draped in sterile fashion.  An Esmarch was used to exsanguinate the limb. Tourniquet was inflated to 250 mmHg.  At this point in time, a volar incision was made over the distal forearm area right side.  Skin was incised longitudinally of the FCR tendon, sheath overlying the FCR was incised.  The FCR was tracked to the midline radial artery lateral side. This interval was developed down the level of pronator quadratus. Pronator quadratus was subperiosteally stripped off the distal radius revealing the fracture site which was intra-articular three-part.  The brachioradialis was carefully released off the distal fragment for easy reduction.  Once this was done, the reduction was obtained with flexion ulnar deviation.  Intraoperative fluoroscopy was then used to determine the appropriate position of the standard DVR plate.  The screw hole was drilled through the slotted hole.  Once position was obtained, 2 more cortical screws were placed proximally followed by smooth pegs distally with the exception of the ulnar floor which  were actually partially- threaded screws.  Intraoperative fluoroscopy revealed adequate reduction in AP, lateral, and oblique view.  The wound was then thoroughly irrigated.  The median nerve was identified in the proximal aspect of the wound tracing the carpal canal.  A path was created dorsal and volar transverse carpal ligament.  Palmar cutaneous branch identified and retracted.  The transverse carpal wound was divided decompressing the nerve.  The wound was irrigated and loosely closed in layers of 2-0 undyed Vicryl to reapproximate the pronator quadratus and a running subcuticular stitch of 4-0 Vicryl Rapide was used to close the skin.  Steri-Strips, 4x4s, fluffs, and a volar splint was applied.  The patient tolerated the procedure well and went to the recovery room in a stable fashion.     Artist Pais Mina Marble, M.D.     MAW/MEDQ  D:  07/02/2011  T:  07/02/2011  Job:  [id number]
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
## 2011-07-03 ENCOUNTER — Encounter (HOSPITAL_COMMUNITY): Payer: Self-pay | Admitting: Orthopedic Surgery

## 2012-03-14 ENCOUNTER — Emergency Department (HOSPITAL_COMMUNITY): Payer: Federal, State, Local not specified - PPO

## 2012-03-14 ENCOUNTER — Emergency Department (HOSPITAL_COMMUNITY)
Admission: EM | Admit: 2012-03-14 | Discharge: 2012-03-14 | Disposition: A | Discharge status: Home / Self Care | Payer: Federal, State, Local not specified - PPO | Attending: Emergency Medicine | Admitting: Emergency Medicine

## 2012-03-14 ENCOUNTER — Encounter (HOSPITAL_COMMUNITY): Payer: Self-pay | Admitting: Emergency Medicine

## 2012-03-14 DIAGNOSIS — Y92410 Unspecified street and highway as the place of occurrence of the external cause: Secondary | ICD-10-CM | POA: Insufficient documentation

## 2012-03-14 DIAGNOSIS — Z79899 Other long term (current) drug therapy: Secondary | ICD-10-CM | POA: Insufficient documentation

## 2012-03-14 DIAGNOSIS — F411 Generalized anxiety disorder: Secondary | ICD-10-CM | POA: Insufficient documentation

## 2012-03-14 DIAGNOSIS — S63501A Unspecified sprain of right wrist, initial encounter: Secondary | ICD-10-CM

## 2012-03-14 DIAGNOSIS — S39012A Strain of muscle, fascia and tendon of lower back, initial encounter: Secondary | ICD-10-CM

## 2012-03-14 DIAGNOSIS — IMO0002 Reserved for concepts with insufficient information to code with codable children: Secondary | ICD-10-CM | POA: Insufficient documentation

## 2012-03-14 DIAGNOSIS — Z8739 Personal history of other diseases of the musculoskeletal system and connective tissue: Secondary | ICD-10-CM | POA: Insufficient documentation

## 2012-03-14 DIAGNOSIS — S63509A Unspecified sprain of unspecified wrist, initial encounter: Secondary | ICD-10-CM | POA: Insufficient documentation

## 2012-03-14 DIAGNOSIS — Y9389 Activity, other specified: Secondary | ICD-10-CM | POA: Insufficient documentation

## 2012-03-14 DIAGNOSIS — Z8669 Personal history of other diseases of the nervous system and sense organs: Secondary | ICD-10-CM | POA: Insufficient documentation

## 2012-03-14 MED ORDER — METHOCARBAMOL 500 MG PO TABS: 500.0000 mg | ORAL_TABLET | Freq: Two times a day (BID) | ORAL | Status: DC

## 2012-03-14 MED ORDER — NAPROXEN 500 MG PO TABS: 500.0000 mg | ORAL_TABLET | Freq: Two times a day (BID) | ORAL | Status: DC

## 2012-03-14 NOTE — Progress Notes (Signed)
Orthopedic Tech Progress Note Patient Details:  Susan Stanton 1990-10-28 [id number] Patient states she has one at home. Ortho Devices Type of Ortho Device: Velcro wrist splint   Jennye Moccasin 03/14/2012, 7:36 PM

## 2012-03-14 NOTE — ED Notes (Signed)
Discontinue wrist splint per PA.
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
## 2012-03-14 NOTE — ED Notes (Signed)
Ortho paged. 
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
## 2012-03-16 NOTE — ED Provider Notes (Signed)
Medical screening examination/treatment/procedure(s) were performed by non-physician practitioner and as supervising physician I was immediately available for consultation/collaboration.    Kikue Gerhart L Sowmya Partridge, MD 03/16/12 1513 

## 2013-08-25 ENCOUNTER — Emergency Department (HOSPITAL_COMMUNITY)
Admission: EM | Admit: 2013-08-25 | Discharge: 2013-08-26 | Disposition: A | Discharge status: Home / Self Care | Payer: Federal, State, Local not specified - PPO | Attending: Emergency Medicine | Admitting: Emergency Medicine

## 2013-08-25 DIAGNOSIS — J040 Acute laryngitis: Secondary | ICD-10-CM

## 2013-08-25 DIAGNOSIS — Z8659 Personal history of other mental and behavioral disorders: Secondary | ICD-10-CM | POA: Diagnosis not present

## 2013-08-25 DIAGNOSIS — B9789 Other viral agents as the cause of diseases classified elsewhere: Secondary | ICD-10-CM | POA: Insufficient documentation

## 2013-08-25 DIAGNOSIS — J029 Acute pharyngitis, unspecified: Secondary | ICD-10-CM

## 2013-08-25 DIAGNOSIS — Z791 Long term (current) use of non-steroidal anti-inflammatories (NSAID): Secondary | ICD-10-CM | POA: Insufficient documentation

## 2013-08-25 DIAGNOSIS — Z79899 Other long term (current) drug therapy: Secondary | ICD-10-CM | POA: Diagnosis not present

## 2013-08-25 DIAGNOSIS — B349 Viral infection, unspecified: Secondary | ICD-10-CM

## 2013-08-25 LAB — RAPID STREP SCREEN (MED CTR MEBANE ONLY): Streptococcus, Group A Screen (Direct): NEGATIVE

## 2013-08-25 NOTE — ED Notes (Signed)
Patient C/O having a sore throat that began Saturday afternoon.  States that her daughter has a cold and she may have caught it.  Patient thinks that she may have strep throat.  Denies fever, chills, N & V but C/O loss of appetite.

## 2013-08-25 NOTE — ED Notes (Signed)
Pt c/o sore throat for two days. Pt reports hx of strep throat with similar symptoms. Pt also has cough.

## 2013-08-25 NOTE — ED Provider Notes (Signed)
CSN: [id number]     Arrival date & time 08/25/13  2037 History  This chart was scribed for non-physician practitioner, Raymon Mutton, PA-C,working with Raeford Razor, MD, by Karle Plumber, ED Scribe. This patient was seen in room TR07C/TR07C and the patient's care was started at 11:43 PM.  Chief Complaint  Patient presents with  . Sore Throat   Patient is a 23 y.o. female presenting with pharyngitis. The history is provided by the patient. No language interpreter was used.  Sore Throat   HPI Comments:  Susan Stanton is a 23 y.o. female who presents to the Emergency Department complaining of moderate sore throat pain onset two days ago. She reports associated hoarseness and cough. She denies taking anything for the pain. Pt reports sick contact stating her daughter caught a cold from daycare. She denies fever, congestion, CP, SOB, hematemesis, throat closing sensation, neck pain, neck stiffness. She reports h/o step throat. She does not have a PCP. She is not a smoker. Pt is sleeping upon entering exam room.  Past Medical History  Diagnosis Date  . No pertinent past medical history   . Back pain     scoliosis  . Anxiety     takes Valium prn   Past Surgical History  Procedure Laterality Date  . No past surgeries    . Carpal tunnel release  07/02/2011    Procedure: CARPAL TUNNEL RELEASE;  Surgeon: Marlowe Shores, MD;  Location: [facility] OR;  Service: Orthopedics;  Laterality: Right;   No family history on file. History  Substance Use Topics  . Smoking status: Never Smoker   . Smokeless tobacco: Not on file  . Alcohol Use: Yes     Comment: rare occasion   OB History   Grav Para Term Preterm Abortions TAB SAB Ect Mult Living   1 1 1       1      Review of Systems  Constitutional: Negative for fever and chills.  HENT: Positive for sore throat and voice change.   Respiratory: Positive for cough.     Allergies  Review of patient's allergies indicates no known  allergies.  Home Medications   Prior to Admission medications   Medication Sig Start Date End Date Taking? Authorizing Provider  Ibuprofen (IBU PO) Take 2-3 tablets by mouth every 8 (eight) hours as needed (pain).    Historical Provider, MD  Levonorgestrel-Ethinyl Estradiol (AMETHIA,CAMRESE) 0.15-0.03 &0.01 MG tablet Take 1 tablet by mouth daily.    Historical Provider, MD  methocarbamol (ROBAXIN) 500 MG tablet Take 1 tablet (500 mg total) by mouth 2 (two) times daily. 03/14/12   Roxy Horseman, PA-C  naproxen (NAPROSYN) 500 MG tablet Take 1 tablet (500 mg total) by mouth 2 (two) times daily with a meal. 03/14/12   Roxy Horseman, PA-C   Triage Vitals: BP 107/57  Pulse 87  Temp(Src) 98.3 F (36.8 C) (Oral)  Resp 18  Ht 5\' 6"  (1.676 m)  Wt 150 lb (68.04 kg)  BMI 24.22 kg/m2  SpO2 100% Physical Exam  Nursing note and vitals reviewed. Constitutional: She is oriented to person, place, and time. She appears well-developed and well-nourished.  HENT:  Head: Normocephalic and atraumatic.  Mouth/Throat: No oropharyngeal exudate.  Mild erythema identified to the posterior oropharynx. Negative swelling, inflammation, lesions, sores, exudate, petechiae identified to bilateral tonsils and soft palate. Negative bilateral tonsillar adenopathy. Uvula midline with symmetrical elevation. Negative trismus. Negative sublingual lesions. Negative uvula deviation. Negative uvula swelling.  Eyes: Conjunctivae and EOM are  normal. Pupils are equal, round, and reactive to light. Right eye exhibits no discharge. Left eye exhibits no discharge.  Neck: Normal range of motion. Neck supple. No tracheal deviation present.  Negative neck stiffness Negative nuchal rigidity Negative cervical lymphadenopathy Negative meningeal signs  Cardiovascular: Normal rate.   Pulmonary/Chest: Effort normal and breath sounds normal. No respiratory distress. She has no wheezes. She has no rales.  Musculoskeletal: Normal range of  motion.  Lymphadenopathy:    She has no cervical adenopathy.  Neurological: She is alert and oriented to person, place, and time. No cranial nerve deficit. She exhibits normal muscle tone. Coordination normal.  Cranial nerves III-XII grossly intact  Skin: Skin is warm and dry.  Psychiatric: She has a normal mood and affect. Her behavior is normal.    ED Course  Procedures (including critical care time) DIAGNOSTIC STUDIES: Oxygen Saturation is 100% on RA, normal by my interpretation.   COORDINATION OF CARE: 11:48 PM- Advised pt to stay hydrated and rest voice. Pt verbalizes understanding and agrees to plan.  Medications - No data to display  Results for orders placed during the hospital encounter of 08/25/13  RAPID STREP SCREEN      Result Value Ref Range   Streptococcus, Group A Screen (Direct) NEGATIVE  NEGATIVE     Labs Review Labs Reviewed  RAPID STREP SCREEN  CULTURE, GROUP A STREP    Imaging Review No results found.   EKG Interpretation None      MDM   Final diagnoses:  Sore throat  Viral illness  Laryngitis    Filed Vitals:   08/25/13 2105  BP: 107/57  Pulse: 87  Temp: 98.3 F (36.8 C)  TempSrc: Oral  Resp: 18  Height: 5\' 6"  (1.676 m)  Weight: 150 lb (68.04 kg)  SpO2: 100%   I personally performed the services described in this documentation, which was scribed in my presence. The recorded information has been reviewed and is accurate.  Rapid strep test negative. Doubt peritonsillar abscess. Doubt streptococcal pharyngitis. Doubt retropharyngeal abscess. Suspicion to be viral sore throat, cannot rule out laryngitis. Patient stable, afebrile. Patient not septic appearing. Discharged patient. Referred to health and wellness and ENT. Discussed with patient to closely monitor symptoms and if symptoms are to worsen or change to report back to the ED - strict return instructions given.  Patient agreed to plan of care, understood, all questions answered.    Raymon MuttonMarissa Lucendia Leard, PA-C 08/26/13 1345

## 2013-08-26 NOTE — Discharge Instructions (Signed)
Please call your doctor for a followup appointment within 24-48 hours. When you talk to your doctor please let them know that you were seen in the emergency department and have them acquire all of your records so that they can discuss the findings with you and formulate a treatment plan to fully care for your new and ongoing problems. Please call and set-up an appointment with ear, nose, and throat physician Please rest and stay hydrated Please drink plenty of water Please continue to monitor symptoms closely and if symptoms are to worsen or change (fever greater than 101, chills, sweating, nausea, vomiting, diarrhea, chest pain, shortness of breathe, difficulty breathing, weakness, neck pain, neck stiffness, throat closing sensation) please report back to the ED immediately   Sore Throat A sore throat is pain, burning, irritation, or scratchiness of the throat. There is often pain or tenderness when swallowing or talking. A sore throat may be accompanied by other symptoms, such as coughing, sneezing, fever, and swollen neck glands. A sore throat is often the first sign of another sickness, such as a cold, flu, strep throat, or mononucleosis (commonly known as mono). Most sore throats go away without medical treatment. CAUSES  The most common causes of a sore throat include:  A viral infection, such as a cold, flu, or mono.  A bacterial infection, such as strep throat, tonsillitis, or whooping cough.  Seasonal allergies.  Dryness in the air.  Irritants, such as smoke or pollution.  Gastroesophageal reflux disease (GERD). HOME CARE INSTRUCTIONS   Only take over-the-counter medicines as directed by your caregiver.  Drink enough fluids to keep your urine clear or pale yellow.  Rest as needed.  Try using throat sprays, lozenges, or sucking on hard candy to ease any pain (if older than 4 years or as directed).  Sip warm liquids, such as broth, herbal tea, or warm water with honey to  relieve pain temporarily. You may also eat or drink cold or frozen liquids such as frozen ice pops.  Gargle with salt water (mix 1 tsp salt with 8 oz of water).  Do not smoke and avoid secondhand smoke.  Put a cool-mist humidifier in your bedroom at night to moisten the air. You can also turn on a hot shower and sit in the bathroom with the door closed for 5-10 minutes. SEEK IMMEDIATE MEDICAL CARE IF:  You have difficulty breathing.  You are unable to swallow fluids, soft foods, or your saliva.  You have increased swelling in the throat.  Your sore throat does not get better in 7 days.  You have nausea and vomiting.  You have a fever or persistent symptoms for more than 2-3 days.  You have a fever and your symptoms suddenly get worse. MAKE SURE YOU:   Understand these instructions.  Will watch your condition.  Will get help right away if you are not doing well or get worse. Document Released: 02/09/2004 Document Revised: 12/19/2011 Document Reviewed: 09/09/2011 [facility] Patient Information 2015 [place], [place]. This information is not intended to replace advice given to you by your health care provider. Make sure you discuss any questions you have with your health care provider.  Laryngitis Laryngitis is redness, soreness, and puffiness (inflammation) of the vocal cords. It causes hoarseness, cough, loss of voice, sore throat, and dry throat. It may be caused by:  Infection.  Too much smoking.  Too much talking or yelling.  Breathing in of toxic fumes.  Allergies.  A backup of acid from your  stomach. HOME CARE  Drink enough fluids to keep your pee (urine) clear or pale yellow.  Rest until you no longer have problems or as told by your doctor.  Breathe in moist air.  Take all medicine as told by your doctor.  Do not smoke.  Talk as little as possible (this includes whispering).  Write on paper instead of talking until your voice is back to normal.  Follow  up with your doctor if you have not improved after 10 days. GET HELP IF:   You have trouble breathing.  You cough up blood.  You have a fever that will not go away.  You have increasing pain.  You have trouble swallowing. MAKE SURE YOU:  Understand these instructions.  Will watch your condition.  Will get help right away if you are not doing well or get worse. Document Released: 12/21/2010 Document Revised: 03/26/2011 Document Reviewed: 12/21/2010 [facility] Patient Information 2015 [place], [place]. This information is not intended to replace advice given to you by your health care provider. Make sure you discuss any questions you have with your health care provider.   Emergency Department Resource Guide 1) Find a Doctor and Pay Out of Pocket Although you won't have to find out who is covered by your insurance plan, it is a good idea to ask around and get recommendations. You will then need to call the office and see if the doctor you have chosen will accept you as a new patient and what types of options they offer for patients who are self-pay. Some doctors offer discounts or will set up payment plans for their patients who do not have insurance, but you will need to ask so you aren't surprised when you get to your appointment.  2) Contact Your Local Health Department Not all health departments have doctors that can see patients for sick visits, but many do, so it is worth a call to see if yours does. If you don't know where your local health department is, you can check in your phone book. The CDC also has a tool to help you locate your state's health department, and many state websites also have listings of all of their local health departments.  3) Find a Walk-in Clinic If your illness is not likely to be very severe or complicated, you may want to try a walk in clinic. These are popping up all over the country in pharmacies, drugstores, and shopping centers. They're usually staffed  by nurse practitioners or physician assistants that have been trained to treat common illnesses and complaints. They're usually fairly quick and inexpensive. However, if you have serious medical issues or chronic medical problems, these are probably not your best option.  No Primary Care Doctor: - Call Health Connect at  [phone] - they can help you locate a primary care doctor that  accepts your insurance, provides certain services, etc. - Physician Referral Service- [phone]  Chronic Pain Problems: Organization         Address  Phone   Notes  [facility]  [phone] Patients need to be referred by their primary care doctor.   Medication Assistance: Organization         Address  Phone   Notes  [facility] Medication [facility] [address] [place], [place] [phone] --Must be a resident of [facility] [place] -- Must have NO insurance coverage whatsoever (no Medicaid/ Medicare, etc.) -- The pt. MUST have a primary care  doctor that directs their care regularly and follows them in the community   Med[facility]ist  [phone]   [facility]  [phone]    Agencies that provide inexpensive medical care: Organization         Address  Phone   Notes  [facility]  [phone]   [facility]    [phone]   [facility] [address] [place], [place] [address] [phone]   [facility] [address], [place] [phone]   Planned Parenthood    [phone]   [facility]    [phone]   [facility]  [address], [place] Phone:  [phone], Fax:  [phone] Hours of Operation:  9 am - 6 pm, M-F.  Also accepts Medicaid/Medicare and self-pay.  [facility]  [address], [place] Phone: [phone], Fax: [phone]. Hours of Operation:   8:30 am - 5:30 pm, M-F.  Also accepts Medicaid and self-pay.  [facility] [place] [address], [place] Phone: [phone]   [facility] [address] Natasha Bence [place], [place] [phone], Ext. [phone] Mondays & Thursdays: 7-9 AM.  First 15 patients are seen on a first come, first serve basis.    Medicaid-accepting [facility] Providers:  Organization         Address  Phone   Notes  [facility] [address], [place] [phone] Also accepts self-pay patients.  [facility] [address] Laurell Josephs [place], [place]  [phone]   [facility] [address], [place] [phone]   [facility] [address] [place] [phone]   Renaye Rakers [address], [place]   [phone] Only accepts [place] patients after they have their name applied to their card.   Self-Pay (no insurance) in [facility]:  Organization         Address  Phone   Notes  Sickle Cell Patients, [facility] [address] [place], [place] [phone]   [facility] [address] [place], [place] [phone]   [facility] [place]  [address], [place] [phone]   [facility]/Dr. Osei-Bonsu  [address], [place] or [address], [place] [phone] Phone number for both [place] and [place] locations is the same.  [facility]gent [facility] and [facility] [address] [place] [phone]   [facility] [address] [place] [address], [place] or [address] [phone]   [facility] [address], [place] [phone], phone; [phone], fax Sees patients 1st and 3rd Saturday of every month.  Must not qualify for public or private insurance (i.e. Medicaid, Medicare,  Health  Choice, Veterans' Benefits)  Household income should be no more than 200% of the poverty level The clinic cannot treat you if you are pregnant or think you are pregnant  Sexually transmitted diseases are not treated at the clinic.    Dental Care: Organization         Address  Phone  Notes  [facility] Department of [facility] [address] [place], [place] [phone] Accepts  children up to age 74 who are enrolled in Medicaid or [place] Health Choice; pregnant women with a Medicaid card; and children who have applied for Medicaid or [place] Health Choice, but were declined, whose parents can pay a reduced fee at time of service.  [facility]  [address], [place] [phone] Accepts children up to age 58 who are enrolled in [place] or [place] Health Choice; pregnant women with a Medicaid card; and children who have applied for Medicaid or [place] Health Choice, but were declined, whose parents can pay a reduced fee at time of service.  [facility]  [address] [place], [place] [phone] Patients are seen by appointment only. Walk-ins are not accepted. [facility] will see patients 73 years of age and older. Monday - Tuesday (8am-5pm) Most Wednesdays (8:30-5pm) $30 per visit, cash only  [facility]  [address], [facility] [phone] Patients are seen by appointment only. Walk-ins are not accepted. [facility] will see patients 20 years of age and older. One Wednesday Evening (Monthly: Volunteer Based).  $30 per visit, cash only  [employer]  [phone] for adults; Children under age 82, call Graduate Pediatric Dentistry at [phone]. Children aged 44-14, please call [phone] to request a pediatric application.  Dental services are provided in all areas of dental care including fillings, crowns and bridges, complete  and partial dentures, implants, gum treatment, root canals, and extractions. Preventive care is also provided. Treatment is provided to both adults and children. Patients are selected via a lottery and there is often a waiting list.   [facility] [address] [place]  [phone] [email or web address]   [facility] [address] [place], [place] [phone], Ext. [phone] Second and Fourth Thursday of each month, opens at 6:30 AM; Clinic ends at 9 AM.  Patients are seen on a first-come first-served basis, and a limited number are seen during each clinic.   [facility]  [address] Ether Griffins [place], [place] [phone]   Eligibility Requirements You must have lived in [place], [place], or [place] counties for at least the last three months.   You cannot be eligible for state or federal sponsored [place], including [employer], [place], or [employer].   You generally cannot be eligible for healthcare insurance through your employer.    How to apply: Eligibility screenings are held every Tuesday and Wednesday afternoon from 1:00 pm until 4:00 pm. You do not need an appointment for the interview!  [facility] [address] [place], [place] [phone]   [facility] Health Department  [phone]   [facility] Health Department  [phone]   [facility] Health Department  [phone]    Behavioral Health Resources in the Community: Intensive Outpatient Programs Organization         Address  Phone  Notes  [facility] [address], [place], [place] [phone]   [facility] [address] [place], [place] [phone]   [facility] [address], [place], [place]  [phone]   [facility] Mental Health [address]  [place], [place] [phone] or [phone]   Substance Abuse Resources Organization          Address  Phone  Notes  [facility] Drug Services  [phone]  [facility]  [phone]   [facility] [place]  [phone]   Floydene Flock  [phone]   Residential & Outpatient Substance Abuse Program  [phone]   Psychological Services Organization         Address  Phone  Notes  [facility] Behavioral Health  [phone]   [facility]  [phone]   [facility] Health [address] [place] [phone] or [phone]    Mobile Crisis Teams Organization         Address  Phone  Notes  [facility], Mobile Crisis Care Unit  [phone]   [facility]  [address] [place], [place] [phone]   Doristine Locks [address] [place] [phone]    Self-Help/Support Groups Organization         Address  Phone             Notes  [facility] [place] - variety of support groups  336- I7437963 Call for more information  Narcotics Anonymous (NA), [facility] [address], [employer] [place]  2 meetings at this location   Statistician         Address  Phone  Notes  [facility] [address],    [place]  [phone]   [facility]  [address] [place] [address], [place], [place] [phone]   [facility] [address] [place], [place] [phone] Admissions: 8am-3pm M-F  [facility] [address],    [place], [place] [phone]   [facility] [address] [place], [place], [place] [phone]   [facility] [address],  [place], [place] [phone]   [facility] - Intensive Outpatient [address] Laure[address] Joseph[address], [place], [place] [phone]   [facility] (Addiction Recovery [facility] [address] [place]  [place], [place] [phone] or [phone]   [facility] ([facility]) [address], [place], [place]  [phone] Accepts Medicaid  [facility] [address]  [place] [phone] Substance Abuse/Addiction Treatment   [facility] Organization         Address  Phone  Notes  [facility]  [phone]   Angie Fava, PhD [address] Ervin Knack [place], [place]   [phone] or ([phone]   [facility] Behavioral   [address] [place], [place] [phone]   [facility] [address] [place], [place] [phone] Insurance/Medicaid/sponsorship through [facility] and Families [address]                                    [place], [place] [phone] Therapy/tele-psych/case  [facility] [address], [place] [phone]    Dr. Lolly Mustache  [phone]   [facility] [place]  [employer] [facility] Dept. 1) [address], [place] 2) [address], [place] 3)  [address], [place] [phone]  [phone]   [facility] Child Abuse Hotline (830)383-6167 or [phone] (After Hours)

## 2013-08-27 NOTE — ED Provider Notes (Signed)
Medical screening examination/treatment/procedure(s) were performed by non-physician practitioner and as supervising physician I was immediately available for consultation/collaboration.   EKG Interpretation None       Chas Axel, MD 08/27/13 0701 

## 2013-08-28 LAB — CULTURE, GROUP A STREP

## 2013-11-16 ENCOUNTER — Encounter (HOSPITAL_COMMUNITY): Payer: Self-pay | Admitting: Emergency Medicine

## 2014-05-16 ENCOUNTER — Encounter (HOSPITAL_COMMUNITY): Payer: Self-pay

## 2014-05-16 ENCOUNTER — Emergency Department (HOSPITAL_COMMUNITY)
Admission: EM | Admit: 2014-05-16 | Discharge: 2014-05-16 | Disposition: A | Discharge status: Home / Self Care | Payer: Federal, State, Local not specified - PPO | Attending: Emergency Medicine | Admitting: Emergency Medicine

## 2014-05-16 DIAGNOSIS — Y9389 Activity, other specified: Secondary | ICD-10-CM | POA: Diagnosis not present

## 2014-05-16 DIAGNOSIS — W540XXA Bitten by dog, initial encounter: Secondary | ICD-10-CM | POA: Diagnosis not present

## 2014-05-16 DIAGNOSIS — Z79899 Other long term (current) drug therapy: Secondary | ICD-10-CM | POA: Diagnosis not present

## 2014-05-16 DIAGNOSIS — Z8659 Personal history of other mental and behavioral disorders: Secondary | ICD-10-CM | POA: Diagnosis not present

## 2014-05-16 DIAGNOSIS — S61237A Puncture wound without foreign body of left little finger without damage to nail, initial encounter: Secondary | ICD-10-CM | POA: Diagnosis not present

## 2014-05-16 DIAGNOSIS — Y998 Other external cause status: Secondary | ICD-10-CM | POA: Diagnosis not present

## 2014-05-16 DIAGNOSIS — S61452A Open bite of left hand, initial encounter: Secondary | ICD-10-CM

## 2014-05-16 DIAGNOSIS — Z23 Encounter for immunization: Secondary | ICD-10-CM | POA: Insufficient documentation

## 2014-05-16 DIAGNOSIS — Y9289 Other specified places as the place of occurrence of the external cause: Secondary | ICD-10-CM | POA: Diagnosis not present

## 2014-05-16 DIAGNOSIS — Z791 Long term (current) use of non-steroidal anti-inflammatories (NSAID): Secondary | ICD-10-CM | POA: Diagnosis not present

## 2014-05-16 MED ORDER — TETANUS-DIPHTH-ACELL PERTUSSIS 5-2.5-18.5 LF-MCG/0.5 IM SUSP
0.5000 mL | Freq: Once | INTRAMUSCULAR | Status: AC
  Administered 2014-05-16: 0.5 mL via INTRAMUSCULAR
  Filled 2014-05-16: qty 0.5

## 2014-05-16 MED ORDER — AMOXICILLIN-POT CLAVULANATE 875-125 MG PO TABS: 1.0000 | ORAL_TABLET | Freq: Two times a day (BID) | ORAL | Status: DC

## 2014-05-16 NOTE — Discharge Instructions (Signed)

## 2014-05-16 NOTE — ED Notes (Signed)
Pt reports she seen dog running down road, when dog went underneath car pt reached for dog and dog bit pt on left pinkie finger.  No bleeding.   Pt doesn't know who the dog belongs to.

## 2014-05-16 NOTE — ED Provider Notes (Signed)
CSN: [id number]     Arrival date & time 05/16/14  1351 History  This chart was scribed for non-physician practitioner, Arthor CaptainAbigail Chanie Soucek, working with Rolan BuccoMelanie Belfi, MD by Richarda Overlieichard Holland, ED Scribe. This patient was seen in room TR10C/TR10C and the patient's care was started at 3:10 PM.  Chief Complaint  Patient presents with  . Animal Bite   The history is provided by the patient. No language interpreter was used.   HPI Comments: Susan Stanton is a 24 y.o. female with no significant medical history who presents to the Emergency Department complaining of stray dog bite on her left fifth digit that occurred at 11:30AM today. Pt states that she has the dog in a crate at her home and says she has not yet notified Animal Control. Pt reports some minimal swelling to her left fifth digit but says she is still able to bend her finger. She states that she is not UTD on her tetanus. Pt reports no alleviating or exacerbating factors at this time.   Past Medical History  Diagnosis Date  . No pertinent past medical history   . Back pain     scoliosis  . Anxiety     takes Valium prn   Past Surgical History  Procedure Laterality Date  . No past surgeries    . Carpal tunnel release  07/02/2011    Procedure: CARPAL TUNNEL RELEASE;  Surgeon: Marlowe ShoresMatthew A Weingold, MD;  Location: [facility] OR;  Service: Orthopedics;  Laterality: Right;   History reviewed. No pertinent family history. History  Substance Use Topics  . Smoking status: Never Smoker   . Smokeless tobacco: Not on file  . Alcohol Use: Yes     Comment: rare occasion   OB History    Gravida Para Term Preterm AB TAB SAB Ectopic Multiple Living   1 1 1       1      Review of Systems  Constitutional: Negative for fever.  Skin: Positive for wound.   Allergies  Hydrocodone and Oxycodone  Home Medications   Prior to Admission medications   Medication Sig Start Date End Date Taking? Authorizing Provider  Ibuprofen (IBU PO) Take 2-3 tablets by  mouth every 8 (eight) hours as needed (pain).    Historical Provider, MD  Levonorgestrel-Ethinyl Estradiol (AMETHIA,CAMRESE) 0.15-0.03 &0.01 MG tablet Take 1 tablet by mouth daily.    Historical Provider, MD  methocarbamol (ROBAXIN) 500 MG tablet Take 1 tablet (500 mg total) by mouth 2 (two) times daily. 03/14/12   Roxy Horsemanobert Browning, PA-C  naproxen (NAPROSYN) 500 MG tablet Take 1 tablet (500 mg total) by mouth 2 (two) times daily with a meal. 03/14/12   Roxy Horsemanobert Browning, PA-C   BP 115/71 mmHg  Pulse 93  Temp(Src) 97.3 F (36.3 C)  Resp 20  Wt 167 lb 3.2 oz (75.841 kg)  SpO2 97%  LMP 04/14/2014 Physical Exam  Constitutional: She is oriented to person, place, and time. She appears well-developed and well-nourished.  HENT:  Head: Normocephalic and atraumatic.  Eyes: Right eye exhibits no discharge. Left eye exhibits no discharge.  Neck: No tracheal deviation present.  Cardiovascular: Normal rate.   Pulmonary/Chest: Effort normal. No respiratory distress.  Abdominal: She exhibits no distension.  Musculoskeletal:  Small puncture wound on the dorsal surface of the left pinky finger. Minimal swelling. Full ROM of the joints.   Neurological: She is alert and oriented to person, place, and time.  Skin: Skin is warm and dry.  Psychiatric: She has a normal  mood and affect. Her behavior is normal.  Nursing note and vitals reviewed.   ED Course  Procedures   DIAGNOSTIC STUDIES: Oxygen Saturation is 97% on RA, normal by my interpretation.    COORDINATION OF CARE: 3:14 PM Discussed treatment plan with pt at bedside and pt agreed to plan.   Labs Review Labs Reviewed - No data to display  Imaging Review No results found.   EKG Interpretation None      MDM   Final diagnoses:  Dog bite of hand, left, initial encounter   Patient with dog bite from a stray to the hand. Dog will be retained by [employer] for obs- no need to treat at this time for rabies. Wound  cleansed. Patient will be discharged with Augmentin and supportive care instructions. I personally performed the services described in this documentation, which was scribed in my presence. The recorded information has been reviewed and is accurate.        Arthor Captain, PA-C 05/19/14 1018  Rolan Bucco, MD 05/19/14 1704

## 2014-05-16 NOTE — ED Notes (Addendum)
Pt reports she was bitten by a stray dog today . Pt has not notified animal control and stated she did not want the dog to be turned in to animal  Control.Currently dog is at the Pt's home. Registration  Notified and will fax form to animal control.

## 2014-10-03 ENCOUNTER — Emergency Department (HOSPITAL_COMMUNITY)
Admission: EM | Admit: 2014-10-03 | Discharge: 2014-10-03 | Disposition: A | Discharge status: Home / Self Care | Payer: Federal, State, Local not specified - PPO | Attending: Emergency Medicine | Admitting: Emergency Medicine

## 2014-10-03 ENCOUNTER — Encounter (HOSPITAL_COMMUNITY): Payer: Self-pay | Admitting: Emergency Medicine

## 2014-10-03 ENCOUNTER — Emergency Department (HOSPITAL_COMMUNITY): Payer: Federal, State, Local not specified - PPO

## 2014-10-03 DIAGNOSIS — S93504A Unspecified sprain of right lesser toe(s), initial encounter: Secondary | ICD-10-CM | POA: Diagnosis not present

## 2014-10-03 DIAGNOSIS — Z791 Long term (current) use of non-steroidal anti-inflammatories (NSAID): Secondary | ICD-10-CM | POA: Diagnosis not present

## 2014-10-03 DIAGNOSIS — Z792 Long term (current) use of antibiotics: Secondary | ICD-10-CM | POA: Diagnosis not present

## 2014-10-03 DIAGNOSIS — Y9289 Other specified places as the place of occurrence of the external cause: Secondary | ICD-10-CM | POA: Insufficient documentation

## 2014-10-03 DIAGNOSIS — W228XXA Striking against or struck by other objects, initial encounter: Secondary | ICD-10-CM | POA: Diagnosis not present

## 2014-10-03 DIAGNOSIS — Y998 Other external cause status: Secondary | ICD-10-CM | POA: Diagnosis not present

## 2014-10-03 DIAGNOSIS — Z79899 Other long term (current) drug therapy: Secondary | ICD-10-CM | POA: Insufficient documentation

## 2014-10-03 DIAGNOSIS — Y9301 Activity, walking, marching and hiking: Secondary | ICD-10-CM | POA: Diagnosis not present

## 2014-10-03 DIAGNOSIS — S99921A Unspecified injury of right foot, initial encounter: Secondary | ICD-10-CM | POA: Diagnosis present

## 2014-10-03 NOTE — ED Provider Notes (Signed)
CSN: [id number]     Arrival date & time 10/03/14  1209 History   First MD Initiated Contact with Patient 10/03/14 1323     Chief Complaint  Patient presents with  . Foot Injury     (Consider location/radiation/quality/duration/timing/severity/associated sxs/prior Treatment) HPI   24 year old female presents for evaluation of toe injury. Patient reports this morning while walking she accidentally stubbed her right pinky toe against a concrete edge. She noticed bruising to the toe afterward and was concerned for a broken toe or dislocated toe. Report mild pain with movement only. No active pain at this time. No specific treatment tried. She denies any other injury and denies any ankle pain.  Past Medical History  Diagnosis Date  . No pertinent past medical history   . Back pain     scoliosis  . Anxiety     takes Valium prn   Past Surgical History  Procedure Laterality Date  . No past surgeries    . Carpal tunnel release  07/02/2011    Procedure: CARPAL TUNNEL RELEASE;  Surgeon: Marlowe Shores, MD;  Location: [facility] OR;  Service: Orthopedics;  Laterality: Right;   History reviewed. No pertinent family history. Social History  Substance Use Topics  . Smoking status: Never Smoker   . Smokeless tobacco: None  . Alcohol Use: Yes     Comment: rare occasion   OB History    Gravida Para Term Preterm AB TAB SAB Ectopic Multiple Living   Review of Systems  Constitutional: Negative for fever.  Musculoskeletal: Positive for arthralgias.  Neurological: Negative for numbness.      Allergies  Hydrocodone and Oxycodone  Home Medications   Prior to Admission medications   Medication Sig Start Date End Date Taking? Authorizing Provider  amoxicillin-clavulanate (AUGMENTIN) 875-125 MG per tablet Take 1 tablet by mouth 2 (two) times daily. One po bid x 7 days 05/16/14   Arthor Captain, PA-C  Ibuprofen (IBU PO) Take 2-3 tablets by mouth every 8 (eight) hours as needed  (pain).    Historical Provider, MD  Levonorgestrel-Ethinyl Estradiol (AMETHIA,CAMRESE) 0.15-0.03 &0.01 MG tablet Take 1 tablet by mouth daily.    Historical Provider, MD  methocarbamol (ROBAXIN) 500 MG tablet Take 1 tablet (500 mg total) by mouth 2 (two) times daily. 03/14/12   Roxy Horseman, PA-C  naproxen (NAPROSYN) 500 MG tablet Take 1 tablet (500 mg total) by mouth 2 (two) times daily with a meal. 03/14/12   Roxy Horseman, PA-C   BP 114/68 mmHg  Pulse 116  Temp(Src) 97.9 F (36.6 C) (Oral)  Resp 18  Ht  (1.676 m)  Wt 158 lb (71.668 kg)  BMI 25.51 kg/m2  SpO2 99%  LMP 09/26/2014 Physical Exam  Constitutional: She appears well-developed and well-nourished. No distress.  HENT:  Head: Atraumatic.  Eyes: Conjunctivae are normal.  Neck: Neck supple.  Musculoskeletal: She exhibits tenderness (R foot: pinky toe with bruising noted to medial toe with mild tenderness, no crepitus or deformity. brisk cap refill.).  Neurological: She is alert.  Skin: No rash noted.  Psychiatric: She has a normal mood and affect.  Nursing note and vitals reviewed.   ED Course  Procedures (including critical care time)  Pt sprained her toe, xray unremarkable. RICE therapy discussed.  Labs Review Labs Reviewed - No data to display  Imaging Review Dg Foot Complete Right  10/03/2014   CLINICAL DATA:  Pt fell off  of a stack of cinder blocks this morning and landed/smashed her 5th phalange of her right foot into the cinder block. She has pain, swelling, and bruising on the digit.  EXAM: RIGHT FOOT COMPLETE - 3+ VIEW  COMPARISON:  None.  FINDINGS: There is no evidence of fracture or dislocation. There is no evidence of arthropathy or other focal bone abnormality. Soft tissues are unremarkable.  IMPRESSION: Negative.   Electronically Signed   By: Esperanza Heir M.D.   On: 10/03/2014 13:53   I have personally reviewed and evaluated these images and lab results as part of my medical decision-making.    EKG Interpretation None      MDM   Final diagnoses:  Sprain of toe, fifth, right, initial encounter    BP 101/61 mmHg  Pulse 89  Temp(Src) 97.9 F (36.6 C) (Oral)  Resp 18  Ht  (1.676 m)  Wt 158 lb (71.668 kg)  BMI 25.51 kg/m2  SpO2 100%  LMP 09/26/2014     Fayrene Helper, PA-C 10/03/14 2236  Margarita Grizzle, MD 10/12/14 1343

## 2014-10-03 NOTE — ED Notes (Signed)
Declined W/C at D/C and was escorted to lobby by RN. 

## 2014-10-03 NOTE — ED Notes (Signed)
Hit right great toe on concrete wall <1 hour ago. Ambulatory to triage. NAD

## 2014-10-03 NOTE — Discharge Instructions (Signed)
You have sprained your right pinky toe.  Follow direction below.  Take tylenol or ibuprofen as needed for pain.  Sprain A sprain is an injury to the soft tissue that connects adjacent bones across a joint (ligament), in which the ligament becomes stretched or torn. The purpose of ligaments is to prevent a joint from moving out side of its intended range of motion. The most common joints of the body to suffer from a sprain are the ankles, knees, and fingers. Sprains are classified into 3 categories: grade 1, grade 2, and grade 3. Grade 1 sprains cause pain, but the tendon is not lengthened. Grade 2 sprains include a lengthened ligament due to the ligament being stretched or partially ruptured. With grade 2 sprains there is still function, although the function may be diminished. Grade 3 sprains are marked by a complete tear of the ligament and the joint usually displays a loss of function.  SYMPTOMS   Pain and tenderness in the area of injury; severity varies with extent of injury.  Swelling of the affected joint (usually).  Redness or bruising in the area of injury, either immediately or several hours after the injury.  Loss of normal mobility of the injured joint. CAUSES  A sprain may occur as a secondary injury to a traumatic event, such as a fall or twisting injury. The ankle is susceptible to sprains because of it is a mechanically weak joint and is exposed during athletic events. RISK INCREASES WITH:  Trauma, especially with high-risk activities, such as sports with a lot of jumping, for knee and ankle sprains (basketball or volleyball); sports with a lot of pivoting motions, for knee sprains (skiing, soccer, or football); and contact sports.  Falls onto outstretched hands and wrists (wrist sprains).  Catching sports, such as water polo and baseball (finger sprains).  Poorly fitting and high-heeled shoes.  Poor field conditions.  Poor strength and flexibility.  Failure to warm-up  properly before activity. PREVENTION  Warm up and stretch properly before activity.  Maintain physical fitness:  Muscle strength.  Endurance and flexibility.  Cardiovascular fitness.  Wear properly fitted and padded protective equipment.  Wrap weak joints with support bandages before strenuous activity. PROGNOSIS  If treated properly, sprains usually heal in 2 to 8 weeks. Occasionally sprains require surgery for healing to occur. RELATED COMPLICATIONS  Permanent instability of a joint if the sprain is severe or if a ligament is repeatedly sprained.  Arthritis of the joint. TREATMENT Treatment involves ice and medicine to relieve pain and inflammation. Rest and immobilization of the injured joint is necessary for healing to occur. Strengthening and stretching exercises may be recommended after immobilization to regain strength and a full range of motion. For severe sprains surgery may be necessary to repair the injured ligament. MEDICATION  If pain medicine is necessary, then nonsteroidal anti-inflammatory medicines, such as aspirin and ibuprofen, or other minor pain relievers, such as acetaminophen, are often recommended.  Do not take pain medicine for 7 days before surgery.  Prescription pain relievers may be prescribed. Use only as directed and only as much as you need.  Cortisone injections are generally not advised for sprains. Cortisone may affect the healing of the ligament. HEAT AND COLD  Cold treatment (icing) relieves pain and reduces inflammation. Cold treatment should be applied for 10 to 15 minutes every 2 to 3 hours for inflammation and pain and immediately after any activity that aggravates your symptoms. Use ice packs or massage the area with a piece  of ice (ice massage).  Heat treatment may be used prior to performing the stretching and strengthening activities prescribed by your caregiver, physical therapist, or athletic trainer. Use a heat pack or soak the  injury in warm water. SEEK MEDICAL CARE IF:  Symptoms get worse or do not improve in 2 to 6 weeks despite treatment. Document Released: 01/01/2005 Document Revised: 03/26/2011 Document Reviewed: 04/15/2008 [facility] Patient Information 2015 [place], [place]. This information is not intended to replace advice given to you by your health care provider. Make sure you discuss any questions you have with your health care provider.

## 2015-08-18 ENCOUNTER — Encounter (HOSPITAL_COMMUNITY): Payer: Self-pay | Admitting: Emergency Medicine

## 2015-08-18 ENCOUNTER — Emergency Department (HOSPITAL_COMMUNITY)
Admission: EM | Admit: 2015-08-18 | Discharge: 2015-08-18 | Disposition: A | Discharge status: Home / Self Care | Payer: BLUE CROSS/BLUE SHIELD | Attending: Emergency Medicine | Admitting: Emergency Medicine

## 2015-08-18 DIAGNOSIS — K0889 Other specified disorders of teeth and supporting structures: Secondary | ICD-10-CM

## 2015-08-18 MED ORDER — TRAMADOL HCL 50 MG PO TABS: 50.0000 mg | ORAL_TABLET | Freq: Four times a day (QID) | ORAL | 0 refills | Status: DC | PRN

## 2015-08-18 MED ORDER — TRAMADOL HCL 50 MG PO TABS
50.0000 mg | ORAL_TABLET | Freq: Once | ORAL | Status: AC
  Administered 2015-08-18: 50 mg via ORAL
  Filled 2015-08-18: qty 1

## 2015-08-18 NOTE — ED Triage Notes (Signed)
The patient said all four of her wisdom teeth are coming in and she is hurting really bad.  The patient said her pain is a 10.  She has taken ibuprofen but it is not working.

## 2015-08-18 NOTE — ED Provider Notes (Signed)
[facility]-EMERGENCY DEPT Provider Note   CSN: [id number] Arrival date & time: 08/18/15  1924  First Provider Contact:  First MD Initiated Contact with Patient 08/18/15 2013   By signing my name below, I, Bridgette Habermann, attest that this documentation has been prepared under the direction and in the presence of Samantha Dowless, PA-C. Electronically Signed: Bridgette Habermann, ED Scribe. 08/18/15. 8:17 PM.   History   Chief Complaint Chief Complaint  Patient presents with  . Jaw Pain    The patient said all four of her wisdom teeth are coming in and she is hurting really bad.  The patient said her pain is a 10.  She has taken ibuprofen but it is not working.   HPI Comments: Susan Stanton is a 25 y.o. female who presents to the Emergency Department complaining of sudden onset, constant, 10/10 left dental pain onset one night ago. Pt states all four of her wisdom teeth are coming in and the left side are coming impacted. Pain is exacerbated when opening her mouth and talking. Pt is tearful on exam. Pt has had this problem before but notes it is usually intermittent, but the pain is constant at this time. She has taken Ibuprofen and Orajel with no relief. 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 Pt denies fever and chills.   The history is provided by the patient. No language interpreter was used.    Past Medical History:  Diagnosis Date  . Anxiety    takes Valium prn  . Back pain    scoliosis  . No pertinent past medical history     There are no active problems to display for this patient.   Past Surgical History:  Procedure Laterality Date  . CARPAL TUNNEL RELEASE  07/02/2011   Procedure: CARPAL TUNNEL RELEASE;  Surgeon: Marlowe Shores, MD;  Location: [facility] OR;  Service: Orthopedics;  Laterality: Right;  . NO PAST SURGERIES      OB History    Gravida Para Term Preterm AB Living   1 1 1     1    SAB TAB Ectopic Multiple Live Births             1       Home Medications    Prior to Admission medications   Medication Sig Start Date End Date Taking? Authorizing Provider  amoxicillin-clavulanate (AUGMENTIN) 875-125 MG per tablet Take 1 tablet by mouth 2 (two) times daily. One po bid x 7 days 05/16/14   Arthor Captain, PA-C  Ibuprofen (IBU PO) Take 2-3 tablets by mouth every 8 (eight) hours as needed (pain).    Historical Provider, MD  Levonorgestrel-Ethinyl Estradiol (AMETHIA,CAMRESE) 0.15-0.03 &0.01 MG tablet Take 1 tablet by mouth daily.    Historical Provider, MD  methocarbamol (ROBAXIN) 500 MG tablet Take 1 tablet (500 mg total) by mouth 2 (two) times daily. 03/14/12   Roxy Horseman, PA-C  naproxen (NAPROSYN) 500 MG tablet Take 1 tablet (500 mg total) by mouth 2 (two) times daily with a meal. 03/14/12   Roxy Horseman, PA-C    Family History History reviewed. No pertinent family history.  Social History Social History  Substance Use Topics  . Smoking status: Never Smoker  . Smokeless tobacco: Never Used  . Alcohol use Yes     Comment: rare occasion     Allergies   Hydrocodone and Oxycodone   Review of Systems Review of Systems  Constitutional: Negative  for chills and fever.  HENT: Positive for dental problem.   All other systems reviewed and are negative.    Physical Exam Updated Vital Signs BP 132/80 (BP Location: Right Arm)   Pulse 78   Temp 98.9 F (37.2 C) (Oral)   Resp 18   LMP 08/17/2015   SpO2 100%   Physical Exam  Constitutional: She is oriented to person, place, and time. She appears well-developed and well-nourished. No distress.  HENT:  Head: Normocephalic and atraumatic.  Impacted wisdom molars in all four sockets. No associated gingival swelling, no dental abscess. Other dental carries present.  Eyes: Conjunctivae are normal. Right eye exhibits no discharge. Left eye exhibits no discharge. No scleral icterus.  Cardiovascular: Normal rate.   Pulmonary/Chest: Effort normal.   Neurological: She is alert and oriented to person, place, and time. Coordination normal.  Skin: Skin is warm and dry. No rash noted. She is not diaphoretic. No erythema. No pallor.  Psychiatric: She has a normal mood and affect. Her behavior is normal.  Nursing note and vitals reviewed.    ED Treatments / Results  DIAGNOSTIC STUDIES: Oxygen Saturation is 100% on RA, normal by my interpretation.    COORDINATION OF CARE: 8:14 PM Discussed treatment plan with pt at bedside which includes pain Rx and pt agreed to plan.  Labs (all labs ordered are listed, but only abnormal results are displayed) Labs Reviewed - No data to display  EKG  EKG Interpretation None       Radiology No results found.  Procedures Procedures (including critical care time)  Medications Ordered in ED Medications - No data to display   Initial Impression / Assessment and Plan / ED Course  I have reviewed the triage vital signs and the nursing notes.  Pertinent labs & imaging results that were available during my care of the patient were reviewed by me and considered in my medical decision making (see chart for details).  Clinical Course    Final Clinical Impressions(s) / ED Diagnoses   Final diagnoses:  Pain, dental   Patient with dentalgia due to impacted wisdom teeth. Pt has seen dentist for this issue previously however, due to insurance reasons cannot have them surgically removed until October. No abscess requiring immediate incision and drainage.  Exam not concerning for Ludwig's angina or pharyngeal abscess. Discussed with pt that she will need re-evaluation by dentist and that they will need to prescribe her ongoing pain medication if they feel it is appropriate. Pt given number for emergency dental hotline. Will treat with an Rx of Tramadol until she can be re-seen by a dentist.  Discussed return precautions. Pt safe for discharge.  New Prescriptions Discharge Medication List as of 08/18/2015   8:43 PM    START taking these medications   Details  traMADol (ULTRAM) 50 MG tablet Take 1 tablet (50 mg total) by mouth every 6 (six) hours as needed., Starting Thu 08/18/2015, Print      I personally performed the services described in this documentation, which was scribed in my presence. The recorded information has been reviewed and is accurate.      Lester Kinsman Bovina, PA-C 08/19/15 0236    Arby Barrette, MD 08/20/15 [phone]

## 2015-08-18 NOTE — Discharge Instructions (Signed)
Call [phone]. This is an emergency dental hotline and they should be able to help you to see a dentist as soon as possible. Return to the ED if you experience significant facial swelling, difficulty swallowing, fevers or chills.

## 2015-08-18 NOTE — ED Notes (Signed)
Patient able to ambulate independently  

## 2015-11-01 ENCOUNTER — Encounter (HOSPITAL_COMMUNITY): Payer: Self-pay | Admitting: *Deleted

## 2015-11-01 ENCOUNTER — Emergency Department (HOSPITAL_COMMUNITY)
Admission: EM | Admit: 2015-11-01 | Discharge: 2015-11-01 | Disposition: A | Discharge status: Home / Self Care | Payer: BLUE CROSS/BLUE SHIELD | Attending: Emergency Medicine | Admitting: Emergency Medicine

## 2015-11-01 ENCOUNTER — Emergency Department (HOSPITAL_COMMUNITY): Payer: BLUE CROSS/BLUE SHIELD

## 2015-11-01 DIAGNOSIS — R519 Headache, unspecified: Secondary | ICD-10-CM

## 2015-11-01 DIAGNOSIS — Y92410 Unspecified street and highway as the place of occurrence of the external cause: Secondary | ICD-10-CM | POA: Diagnosis not present

## 2015-11-01 DIAGNOSIS — S161XXA Strain of muscle, fascia and tendon at neck level, initial encounter: Secondary | ICD-10-CM | POA: Insufficient documentation

## 2015-11-01 DIAGNOSIS — Y999 Unspecified external cause status: Secondary | ICD-10-CM | POA: Diagnosis not present

## 2015-11-01 DIAGNOSIS — R51 Headache: Secondary | ICD-10-CM | POA: Diagnosis not present

## 2015-11-01 DIAGNOSIS — Y939 Activity, unspecified: Secondary | ICD-10-CM | POA: Diagnosis not present

## 2015-11-01 DIAGNOSIS — S199XXA Unspecified injury of neck, initial encounter: Secondary | ICD-10-CM | POA: Diagnosis present

## 2015-11-01 MED ORDER — IBUPROFEN 800 MG PO TABS: 800.0000 mg | ORAL_TABLET | Freq: Three times a day (TID) | ORAL | 0 refills | Status: DC | PRN

## 2015-11-01 NOTE — Discharge Instructions (Signed)
Return here as needed.  Your CT scans were normal.  Use ice and heat on the areas that are sore.  You can expect to be more sore tomorrow and over the next 7-10 days
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
## 2015-11-02 ENCOUNTER — Encounter (HOSPITAL_COMMUNITY): Payer: Self-pay | Admitting: Emergency Medicine

## 2015-11-02 ENCOUNTER — Emergency Department (HOSPITAL_COMMUNITY)
Admission: EM | Admit: 2015-11-02 | Discharge: 2015-11-02 | Disposition: A | Discharge status: Home / Self Care | Payer: BLUE CROSS/BLUE SHIELD | Attending: Emergency Medicine | Admitting: Emergency Medicine

## 2015-11-02 DIAGNOSIS — S161XXD Strain of muscle, fascia and tendon at neck level, subsequent encounter: Secondary | ICD-10-CM | POA: Diagnosis not present

## 2015-11-02 MED ORDER — CYCLOBENZAPRINE HCL 5 MG PO TABS: 5.0000 mg | ORAL_TABLET | Freq: Three times a day (TID) | ORAL | 0 refills | Status: DC | PRN
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
## 2015-11-02 NOTE — ED Notes (Signed)
Pt is in stable condition upon d/c and ambulates from ED. 

## 2015-11-02 NOTE — Discharge Instructions (Signed)
The muscle relaxant can make you sleepy. °
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
## 2017-03-07 ENCOUNTER — Encounter: Payer: Self-pay | Admitting: Family Medicine

## 2017-03-07 ENCOUNTER — Ambulatory Visit (INDEPENDENT_AMBULATORY_CARE_PROVIDER_SITE_OTHER): Payer: BLUE CROSS/BLUE SHIELD | Admitting: Family Medicine

## 2017-03-07 VITALS — BP 130/80 | HR 98 | Ht 65.0 in | Wt 182.0 lb

## 2017-03-07 DIAGNOSIS — Z0000 Encounter for general adult medical examination without abnormal findings: Secondary | ICD-10-CM

## 2017-03-07 LAB — POCT URINALYSIS DIP (PROADVANTAGE DEVICE)
Bilirubin, UA: NEGATIVE
Blood, UA: NEGATIVE
Glucose, UA: NEGATIVE mg/dL
Ketones, POC UA: NEGATIVE mg/dL
Leukocytes, UA: NEGATIVE
NITRITE UA: NEGATIVE
PROTEIN UA: NEGATIVE mg/dL
Specific Gravity, Urine: 1.015
UUROB: NEGATIVE
pH, UA: 7.5 (ref 5.0–8.0)

## 2017-03-07 NOTE — Patient Instructions (Addendum)
It appears that you will be due for a pap smear next year per guidelines.   We will call you with your lab results.   Preventative Care for Adults - Female      MAINTAIN REGULAR HEALTH EXAMS:  A routine yearly physical is a good way to check in with your primary care provider about your health and preventive screening. It is also an opportunity to share updates about your health and any concerns you have, and receive a thorough all-over exam.   Most health insurance companies pay for at least some preventative services.  Check with your health plan for specific coverages.  WHAT PREVENTATIVE SERVICES DO WOMEN NEED?  Adult women should have their weight and blood pressure checked regularly.   Women age 75 and older should have their cholesterol levels checked regularly.  Women should be screened for cervical cancer with a Pap smear and pelvic exam beginning at age 3.   Breast cancer screening generally begins at age 24 with a mammogram and breast exam by your primary care provider.    Beginning at age 33 and continuing to age 75, women should be screened for colorectal cancer.  Certain people may need continued testing until age 16.  Updating vaccinations is part of preventative care.  Vaccinations help protect against diseases such as the flu.  Osteoporosis is a disease in which the bones lose minerals and strength as we age. Women ages 34 and over should discuss this with their caregivers, as should women after menopause who have other risk factors.  Lab tests are generally done as part of preventative care to screen for anemia and blood disorders, to screen for problems with the kidneys and liver, to screen for bladder problems, to check blood sugar, and to check your cholesterol level.  Preventative services generally include counseling about diet, exercise, avoiding tobacco, drugs, excessive alcohol consumption, and sexually transmitted infections.    GENERAL RECOMMENDATIONS FOR  GOOD HEALTH:  Healthy diet:  Eat a variety of foods, including fruit, vegetables, animal or vegetable protein, such as meat, fish, chicken, and eggs, or beans, lentils, tofu, and grains, such as rice.  Drink plenty of water daily.  Decrease saturated fat in the diet, avoid lots of red meat, processed foods, sweets, fast foods, and fried foods.  Exercise:  Aerobic exercise helps maintain good heart health. At least 30-40 minutes of moderate-intensity exercise is recommended. For example, a brisk walk that increases your heart rate and breathing. This should be done on most days of the week.   Find a type of exercise or a variety of exercises that you enjoy so that it becomes a part of your daily life.  Examples are running, walking, swimming, water aerobics, and biking.  For motivation and support, explore group exercise such as aerobic class, spin class, Zumba, Yoga,or  martial arts, etc.    Set exercise goals for yourself, such as a certain weight goal, walk or run in a race such as a 5k walk/run.  Speak to your primary care provider about exercise goals.  Disease prevention:  If you smoke or chew tobacco, find out from your caregiver how to quit. It can literally save your life, no matter how long you have been a tobacco user. If you do not use tobacco, never begin.   Maintain a healthy diet and normal weight. Increased weight leads to problems with blood pressure and diabetes.   The Body Mass Index or BMI is a way of measuring how  much of your body is fat. Having a BMI above 27 increases the risk of heart disease, diabetes, hypertension, stroke and other problems related to obesity. Your caregiver can help determine your BMI and based on it develop an exercise and dietary program to help you achieve or maintain this important measurement at a healthful level.  High blood pressure causes heart and blood vessel problems.  Persistent high blood pressure should be treated with medicine if  weight loss and exercise do not work.   Fat and cholesterol leaves deposits in your arteries that can block them. This causes heart disease and vessel disease elsewhere in your body.  If your cholesterol is found to be high, or if you have heart disease or certain other medical conditions, then you may need to have your cholesterol monitored frequently and be treated with medication.   Ask if you should have a cardiac stress test if your history suggests this. A stress test is a test done on a treadmill that looks for heart disease. This test can find disease prior to there being a problem.  Menopause can be associated with physical symptoms and risks. Hormone replacement therapy is available to decrease these. You should talk to your caregiver about whether starting or continuing to take hormones is right for you.   Osteoporosis is a disease in which the bones lose minerals and strength as we age. This can result in serious bone fractures. Risk of osteoporosis can be identified using a bone density scan. Women ages 9 and over should discuss this with their caregivers, as should women after menopause who have other risk factors. Ask your caregiver whether you should be taking a calcium supplement and Vitamin D, to reduce the rate of osteoporosis.   Avoid drinking alcohol in excess (more than two drinks per day).  Avoid use of street drugs. Do not share needles with anyone. Ask for professional help if you need assistance or instructions on stopping the use of alcohol, cigarettes, and/or drugs.  Brush your teeth twice a day with fluoride toothpaste, and floss once a day. Good oral hygiene prevents tooth decay and gum disease. The problems can be painful, unattractive, and can cause other health problems. Visit your dentist for a routine oral and dental check up and preventive care every 6-12 months.   Look at your skin regularly.  Use a mirror to look at your back. Notify your caregivers of changes in  moles, especially if there are changes in shapes, colors, a size larger than a pencil eraser, an irregular border, or development of new moles.  Safety:  Use seatbelts 100% of the time, whether driving or as a passenger.  Use safety devices such as hearing protection if you work in environments with loud noise or significant background noise.  Use safety glasses when doing any work that could send debris in to the eyes.  Use a helmet if you ride a bike or motorcycle.  Use appropriate safety gear for contact sports.  Talk to your caregiver about gun safety.  Use sunscreen with a SPF (or skin protection factor) of 15 or greater.  Lighter skinned people are at a greater risk of skin cancer. Don't forget to also wear sunglasses in order to protect your eyes from too much damaging sunlight. Damaging sunlight can accelerate cataract formation.   Practice safe sex. Use condoms. Condoms are used for birth control and to help reduce the spread of sexually transmitted infections (or STIs).  Some of the STIs  are gonorrhea (the clap), chlamydia, syphilis, trichomonas, herpes, HPV (human papilloma virus) and HIV (human immunodeficiency virus) which causes AIDS. The herpes, HIV and HPV are viral illnesses that have no cure. These can result in disability, cancer and death.   Keep carbon monoxide and smoke detectors in your home functioning at all times. Change the batteries every 6 months or use a model that plugs into the wall.   Vaccinations:  Stay up to date with your tetanus shots and other required immunizations. You should have a booster for tetanus every 10 years. Be sure to get your flu shot every year, since 5%-20% of the [place] population comes down with the flu. The flu vaccine changes each year, so being vaccinated once is not enough. Get your shot in the fall, before the flu season peaks.   Other vaccines to consider:  Human Papilloma Virus or HPV causes cancer of the cervix, and other infections that  can be transmitted from person to person. There is a vaccine for HPV, and females should get immunized between the ages of 5711 and 5326. It requires a series of 3 shots.   Pneumococcal vaccine to protect against certain types of pneumonia.  This is normally recommended for adults age 28 or older.  However, adults younger than 28 years old with certain underlying conditions such as diabetes, heart or lung disease should also receive the vaccine.  Shingles vaccine to protect against Varicella Zoster if you are older than age 28, or younger than 28 years old with certain underlying illness.  Hepatitis A vaccine to protect against a form of infection of the liver by a virus acquired from food.  Hepatitis B vaccine to protect against a form of infection of the liver by a virus acquired from blood or body fluids, particularly if you work in health care.  If you plan to travel internationally, check with your local health department for specific vaccination recommendations.  Cancer Screening:  Breast cancer screening is essential to preventive care for women. All women age 28 and older should perform a breast self-exam every month. At age 28 and older, women should have their caregiver complete a breast exam each year. Women at ages 6640 and older should have a mammogram (x-ray film) of the breasts. Your caregiver can discuss how often you need mammograms.    Cervical cancer screening includes taking a Pap smear (sample of cells examined under a microscope) from the cervix (end of the uterus). It also includes testing for HPV (Human Papilloma Virus, which can cause cervical cancer). Screening and a pelvic exam should begin at age 28, or 3 years after a woman becomes sexually active. Screening should occur every year, with a Pap smear but no HPV testing, up to age 330. After age 28, you should have a Pap smear every 3 years with HPV testing, if no HPV was found previously.   Most routine colon cancer screening  begins at the age of 28. On a yearly basis, doctors may provide special easy to use take-home tests to check for hidden blood in the stool. Sigmoidoscopy or colonoscopy can detect the earliest forms of colon cancer and is life saving. These tests use a small camera at the end of a tube to directly examine the colon. Speak to your caregiver about this at age 28, when routine screening begins (and is repeated every 5 years unless early forms of pre-cancerous polyps or small growths are found).

## 2017-03-07 NOTE — Progress Notes (Signed)
Subjective:    Patient ID: Susan Stanton, female    DOB: [birth year], 27 y.o.   MRN: [id number]  HPI Chief Complaint  Patient presents with  . new pt    new pt cpe   She is new to the practice and here for a complete physical exam. Previous medical care: No PCP. [place]  Last CPE: a few years ago.   Denies any concerns today.  She does have a form with her that she would like for me to sign regarding her upcoming physical agility test to [place] to the [employer] [facility] [employer].  Other providers: dentist   Denies history of anxiety even though this is in her history.   Social history: Lives with husband and 28 year old.  works for [employer].  She is applying to join the [employer].  Diet: fairly healthy  Excerise: 2-3 days per week  Immunizations: flu shot- refused. Tdap up to date.   Health maintenance:  Mammogram: N/A  Colonoscopy: N/A Last Gynecological Exam: Pap smear last year and normal per patient.  [place] was done in [place] and as part of a research study per patient.  Denies ever having an abnormal Pap smear.  States she is HPV negative Last Menstrual cycle: currently  Pregnancies: 1 Last Dental Exam: last week  Last Eye Exam: years ago. Normal visual acuity.   Wears seatbelt always, uses sunscreen, smoke detectors in home and functioning, does not text while driving and feels safe in home environment.   Reviewed allergies, medications, past medical, surgical, family, and social history.     Review of Systems Review of Systems Constitutional: -fever, -chills, -sweats, -unexpected weight change,-fatigue ENT: -runny nose, -ear pain, -sore throat Cardiology:  -chest pain, -palpitations, -edema Respiratory: -cough, -shortness of breath, -wheezing Gastroenterology: -abdominal pain, -nausea, -vomiting, -diarrhea, -constipation  Hematology: -bleeding or bruising problems Musculoskeletal: -arthralgias,  -myalgias, -joint swelling, -back pain Ophthalmology: -vision changes Urology: -dysuria, -difficulty urinating, -hematuria, -urinary frequency, -urgency Neurology: -headache, -weakness, -tingling, -numbness       Objective:   Physical Exam BP 130/80   Pulse 98   Ht 5\' 5"  (1.651 m)   Wt 182 lb (82.6 kg)   LMP 03/03/2017   BMI 30.29 kg/m   General Appearance:    Alert, cooperative, no distress, appears stated age  Head:    Normocephalic, without obvious abnormality, atraumatic  Eyes:    PERRL, conjunctiva/corneas clear, EOM's intact, fundi    benign  Ears:    Normal TM's and external ear canals  Nose:   Nares normal, mucosa normal, no drainage or sinus   tenderness  Throat:   Lips, mucosa, and tongue normal; teeth and gums normal  Neck:   Supple, no lymphadenopathy;  thyroid:  no   enlargement/tenderness/nodules; no carotid   bruit or JVD  Back:    Spine nontender, no curvature, ROM normal, no CVA     tenderness  Lungs:     Clear to auscultation bilaterally without wheezes, rales or     ronchi; respirations unlabored  Chest Wall:    No tenderness or deformity   Heart:    Regular rate and rhythm, S1 and S2 normal, no murmur, rub   or gallop  Breast Exam:   Declines.  Abdomen:     Soft, non-tender, nondistended, normoactive bowel sounds,    no masses, no hepatosplenomegaly  Genitalia:   Declines.  Pap smear up-to-date per patient.  Rectal:    Not performed due  to age<40 and no related complaints  Extremities:   No clubbing, cyanosis or edema  Pulses:   2+ and symmetric all extremities  Skin:   Skin color, texture, turgor normal, no rashes or lesions  Lymph nodes:   Cervical, supraclavicular, and axillary nodes normal  Neurologic:   CNII-XII intact, normal strength, sensation and gait; reflexes 2+ and symmetric throughout          Psych:   Normal mood, affect, hygiene and grooming.     Urinalysis dipstick: negative        Assessment & Plan:  Routine general medical  examination at a health care facility - Plan: POCT Urinalysis DIP (Proadvantage Device), CBC with Differential/Platelet, Comprehensive metabolic panel   She appears to be doing well physically and emotionally.  There is a contraindication in her chart regarding history of anxiety.  She denies ever having problems with anxiety. Counseled on healthy diet and exercise. Discussed safety and health promotion. We will attempt to get Pap smear results from her previous provider in [place]. Immunizations reviewed and Tdap is up-to-date.  She refuses the flu shot. Follow-up pending labs.  She declines fasting lipids today. I did sign off on a form that allows her to participate in physical fitness/agility test for the [facility].  There is no obvious reason from a cardiopulmonary or orthopedic standpoint that she should not be allowed to participate.

## 2017-03-08 LAB — COMPREHENSIVE METABOLIC PANEL
ALBUMIN: 4.1 g/dL (ref 3.5–5.5)
ALT: 13 IU/L (ref 0–32)
AST: 16 IU/L (ref 0–40)
Albumin/Globulin Ratio: 1.8 (ref 1.2–2.2)
Alkaline Phosphatase: 63 IU/L (ref 39–117)
BUN / CREAT RATIO: 11 (ref 9–23)
BUN: 9 mg/dL (ref 6–20)
Bilirubin Total: 0.4 mg/dL (ref 0.0–1.2)
CO2: 24 mmol/L (ref 20–29)
CREATININE: 0.85 mg/dL (ref 0.57–1.00)
Calcium: 8.8 mg/dL (ref 8.7–10.2)
Chloride: 102 mmol/L (ref 96–106)
GFR calc Af Amer: 108 mL/min/{1.73_m2} (ref >59)
GFR calc non Af Amer: 94 mL/min/{1.73_m2} (ref >59)
GLUCOSE: 93 mg/dL (ref 65–99)
Globulin, Total: 2.3 g/dL (ref 1.5–4.5)
Potassium: 4.4 mmol/L (ref 3.5–5.2)
Sodium: 141 mmol/L (ref 134–144)
TOTAL PROTEIN: 6.4 g/dL (ref 6.0–8.5)

## 2017-03-08 LAB — CBC WITH DIFFERENTIAL/PLATELET
BASOS ABS: 0 10*3/uL (ref 0.0–0.2)
Basos: 0 %
EOS (ABSOLUTE): 0.1 10*3/uL (ref 0.0–0.4)
EOS: 1 %
HEMATOCRIT: 42.1 % (ref 34.0–46.6)
HEMOGLOBIN: 14.5 g/dL (ref 11.1–15.9)
Immature Grans (Abs): 0 10*3/uL (ref 0.0–0.1)
Immature Granulocytes: 0 %
LYMPHS: 33 %
Lymphocytes Absolute: 2.8 10*3/uL (ref 0.7–3.1)
MCH: 30.8 pg (ref 26.6–33.0)
MCHC: 34.4 g/dL (ref 31.5–35.7)
MCV: 89 fL (ref 79–97)
MONOCYTES: 7 %
Monocytes Absolute: 0.6 10*3/uL (ref 0.1–0.9)
NEUTROS ABS: 5 10*3/uL (ref 1.4–7.0)
Neutrophils: 59 %
Platelets: 213 10*3/uL (ref 150–379)
RBC: 4.71 x10E6/uL (ref 3.77–5.28)
RDW: 12.7 % (ref 12.3–15.4)
WBC: 8.6 10*3/uL (ref 3.4–10.8)

## 2017-03-18 ENCOUNTER — Ambulatory Visit (HOSPITAL_COMMUNITY)
Admission: EM | Admit: 2017-03-18 | Discharge: 2017-03-18 | Disposition: A | Discharge status: Home / Self Care | Payer: BLUE CROSS/BLUE SHIELD | Attending: Emergency Medicine | Admitting: Emergency Medicine

## 2017-03-18 ENCOUNTER — Ambulatory Visit (INDEPENDENT_AMBULATORY_CARE_PROVIDER_SITE_OTHER): Payer: BLUE CROSS/BLUE SHIELD

## 2017-03-18 ENCOUNTER — Encounter (HOSPITAL_COMMUNITY): Payer: Self-pay | Admitting: Emergency Medicine

## 2017-03-18 DIAGNOSIS — M25531 Pain in right wrist: Secondary | ICD-10-CM

## 2017-03-18 DIAGNOSIS — W19XXXA Unspecified fall, initial encounter: Secondary | ICD-10-CM

## 2017-03-18 NOTE — ED Triage Notes (Signed)
Pt sts trip and fall last night landing on right wrist; pt sts feels like fingers are swelling today with hx of sx to same arm; CMS intact
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
## 2017-03-18 NOTE — Discharge Instructions (Signed)
Ice and elevation, especially after use. Ibuprofen or aleve regularly for the next week. Splint as recommended, especially with lifting. If symptoms worsen or without improvement in the next 2 weeks please follow up with orthopedics for further evaluation.

## 2017-07-19 ENCOUNTER — Emergency Department (HOSPITAL_COMMUNITY)
Admission: EM | Admit: 2017-07-19 | Discharge: 2017-07-19 | Disposition: A | Discharge status: Home / Self Care | Payer: BLUE CROSS/BLUE SHIELD | Attending: Emergency Medicine | Admitting: Emergency Medicine

## 2017-07-19 ENCOUNTER — Other Ambulatory Visit: Payer: Self-pay

## 2017-07-19 ENCOUNTER — Encounter (HOSPITAL_COMMUNITY): Payer: Self-pay | Admitting: Emergency Medicine

## 2017-07-19 DIAGNOSIS — Y999 Unspecified external cause status: Secondary | ICD-10-CM | POA: Diagnosis not present

## 2017-07-19 DIAGNOSIS — Y939 Activity, unspecified: Secondary | ICD-10-CM | POA: Diagnosis not present

## 2017-07-19 DIAGNOSIS — S40861A Insect bite (nonvenomous) of right upper arm, initial encounter: Secondary | ICD-10-CM | POA: Diagnosis not present

## 2017-07-19 DIAGNOSIS — Y929 Unspecified place or not applicable: Secondary | ICD-10-CM | POA: Diagnosis not present

## 2017-07-19 DIAGNOSIS — W57XXXA Bitten or stung by nonvenomous insect and other nonvenomous arthropods, initial encounter: Secondary | ICD-10-CM | POA: Insufficient documentation

## 2017-07-19 MED ORDER — CEPHALEXIN 500 MG PO CAPS: 500.0000 mg | ORAL_CAPSULE | Freq: Four times a day (QID) | ORAL | 0 refills | Status: DC

## 2017-07-19 MED ORDER — FAMOTIDINE 20 MG PO TABS
20.0000 mg | ORAL_TABLET | Freq: Once | ORAL | Status: AC
  Administered 2017-07-19: 20 mg via ORAL
  Filled 2017-07-19: qty 1

## 2017-07-19 MED ORDER — DIPHENHYDRAMINE HCL 25 MG PO CAPS
25.0000 mg | ORAL_CAPSULE | Freq: Once | ORAL | Status: AC
  Administered 2017-07-19: 25 mg via ORAL
  Filled 2017-07-19: qty 1

## 2017-07-19 MED ORDER — FAMOTIDINE 20 MG PO TABS: 20.0000 mg | ORAL_TABLET | Freq: Two times a day (BID) | ORAL | 0 refills | Status: DC

## 2017-07-19 MED ORDER — DIPHENHYDRAMINE HCL 25 MG PO TABS: 25.0000 mg | ORAL_TABLET | Freq: Four times a day (QID) | ORAL | 0 refills | Status: DC

## 2017-07-19 MED ORDER — PREDNISONE 20 MG PO TABS: 40.0000 mg | ORAL_TABLET | Freq: Every day | ORAL | 0 refills | Status: AC

## 2017-07-19 NOTE — Discharge Instructions (Signed)
Take Benadryl, Pepcid and prednisone as directed.  As we discussed, try these medications for 24 hours, and if the redness or swelling gets worse, you can start the antibiotic.  At this time, does not look infected.  I provided you the antibiotic prescription if it gets worse.  Only fill it and take it if your symptoms do not improve or get worse.  As we discussed, apply ice to help with the pain and swelling.  Follow-up with your primary care doctor in the next 2 to 4 days for further evaluation.  Return to the emergency department for any fever, worsening redness or swelling, numbness/weakness of the arm or any other worsening or concerning symptoms.

## 2017-07-19 NOTE — ED Triage Notes (Signed)
C/o 2 insect bites to posterior R arm since yesterday.

## 2017-07-19 NOTE — ED Notes (Addendum)
Pt BP lower than earlier alerted PA given sprite, pt states she is leaving now. Does not want to wait for repeat BP. Pt asymptomatic and walked out

## 2017-07-19 NOTE — ED Provider Notes (Addendum)
[facility] EMERGENCY DEPARTMENT Provider Note   CSN: [id number] Arrival date & time: 07/19/17  2019     History   Chief Complaint Chief Complaint  Patient presents with  . Insect Bite    HPI Susan Stanton is a 27 y.o. female past medical history of anxiety who presents for evaluation of insect bite to right upper extremity that began yesterday.  Patient reports that she was outside at a party and states that yesterday she felt a bug bite to the posterior aspect of her right upper arm.  Patient reports that she had a second bite noted towards the axilla region.  Patient reports that since yesterday, the area has gotten more erythematous, more edematous.  She also reports pain and warmth to the area.  She does not have any history of allergies and does not know of any allergic reactions to any bug bites.  Patient reports she has not had any fever.  She denies any difficulty breathing, vomiting, difficulty tolerating p.o., swelling of her lips or tongue.  The history is provided by the patient.    Past Medical History:  Diagnosis Date  . Anxiety    takes Valium prn  . Back pain    scoliosis  . No pertinent past medical history     There are no active problems to display for this patient.   Past Surgical History:  Procedure Laterality Date  . CARPAL TUNNEL RELEASE  07/02/2011   Procedure: CARPAL TUNNEL RELEASE;  Surgeon: Marlowe Shores, MD;  Location: [facility] OR;  Service: Orthopedics;  Laterality: Right;     OB History    Gravida  1   Para  1   Term  1   Preterm      AB      Living  1     SAB      TAB      Ectopic      Multiple      Live Births  1            Home Medications    Prior to Admission medications   Medication Sig Start Date End Date Taking? Authorizing Provider  cephALEXin (KEFLEX) 500 MG capsule Take 1 capsule (500 mg total) by mouth 4 (four) times daily. 07/19/17   Maxwell Caul, PA-C  diphenhydrAMINE  (BENADRYL) 25 MG tablet Take 1 tablet (25 mg total) by mouth every 6 (six) hours for 4 days. 07/19/17 07/23/17  Maxwell Caul, PA-C  famotidine (PEPCID) 20 MG tablet Take 1 tablet (20 mg total) by mouth 2 (two) times daily for 4 days. 07/19/17 07/23/17  Maxwell Caul, PA-C  ibuprofen (ADVIL,MOTRIN) 800 MG tablet Take 1 tablet (800 mg total) by mouth every 8 (eight) hours as needed. 11/01/15   Lawyer, Cristal Deer, PA-C  predniSONE (DELTASONE) 20 MG tablet Take 2 tablets (40 mg total) by mouth daily for 4 days. 07/19/17 07/23/17  Maxwell Caul, PA-C    Family History Family History  Problem Relation Age of Onset  . Scoliosis Mother   . Diabetes Father   . Hypertension Father   . Hyperlipidemia Father   . Epilepsy Brother     Social History Social History   Tobacco Use  . Smoking status: Never Smoker  . Smokeless tobacco: Never Used  Substance Use Topics  . Alcohol use: Yes    Comment: rare occasion  . Drug use: No     Allergies   Hydrocodone and Oxycodone  Review of Systems Review of Systems  Constitutional: Negative for fever.  HENT: Negative for drooling, hearing loss and trouble swallowing.   Respiratory: Negative for shortness of breath.   Skin: Positive for color change and wound.  Neurological: Negative for weakness and numbness.     Physical Exam Updated Vital Signs BP 105/72 (BP Location: Left Arm)   Pulse 75   Temp 98.4 F (36.9 C) (Oral)   Resp 16   Ht 5\' 6"  (1.676 m)   Wt 81.6 kg (180 lb)   LMP 07/14/2017   SpO2 100%   BMI 29.05 kg/m   Physical Exam  Constitutional: She appears well-developed and well-nourished.  HENT:  Head: Normocephalic and atraumatic.  Uvula is midline. No trismus. Airway is patent, phonation is intact. Tonsils are symmetric in appearance.   Eyes: Conjunctivae and EOM are normal. Right eye exhibits no discharge. Left eye exhibits no discharge. No scleral icterus.  Cardiovascular:  Pulses:      Radial pulses are 2+ on the  right side, and 2+ on the left side.  Pulmonary/Chest: Effort normal.  Lungs clear to auscultation bilaterally.  Symmetric chest rise.  No wheezing, rales, rhonchi.  Able speak in full sentences without any difficulty.  Musculoskeletal:  Soft compartments to BUE  Neurological: She is alert.  Skin: Skin is warm and dry. Capillary refill takes less than 2 seconds. Rash noted. Rash is urticarial.     Psychiatric: She has a normal mood and affect. Her speech is normal and behavior is normal.  Nursing note and vitals reviewed.    ED Treatments / Results  Labs (all labs ordered are listed, but only abnormal results are displayed) Labs Reviewed - No data to display  EKG None  Radiology No results found.  Procedures Procedures (including critical care time)  Medications Ordered in ED Medications  diphenhydrAMINE (BENADRYL) capsule 25 mg (25 mg Oral Given 07/19/17 2159)  famotidine (PEPCID) tablet 20 mg (20 mg Oral Given 07/19/17 2159)     Initial Impression / Assessment and Plan / ED Course  I have reviewed the triage vital signs and the nursing notes.  Pertinent labs & imaging results that were available during my care of the patient were reviewed by me and considered in my medical decision making (see chart for details).     27 year old female who presents for evaluation of insect bite to the posterior aspect of her right upper extremity that occurred yesterday.  Patient reports more pain, warmth, erythema to the area.  No fevers, difficulty breathing, facial swelling. Patient is afebrile, non-toxic appearing, sitting comfortably on examination table. Vital signs reviewed and stable. Patient is neurovascularly intact.  Lungs clear to auscultation.  No evidence of anaphylaxis.  On exam, patient is a 5 cm area in diameter of erythema, warmth.  There is a central insect bite noted.  Appears almost be urticarial in nature.  She has a small 2 cm area of erythema just posterior to the  axilla.  Consistent with insect bites.  At this time, they do not do not appear infected.  Patient is concerned about possibility of infection.  I explained to patient that this appears to be a localized inflammatory response to the insect bite.  Additionally, given the urticarial appearance, could be part of histamine reaction.  We will plan to give symptomatic relief.  At this time, no antibiotic's are indicated.  Patient is concerned that this may spread and cause a muscle infection.  I told patient that I  would have be happy to provide her with an antibiotic prescription but to not take it for the next 24 to 48 hours.  I encouraged her to watch her symptoms and you supportive care therapies.  Encourage her only to fill and take the antibiotic if her symptoms worsen.  Instructed primary care follow-up in the next 2 to 4 days for further evaluation. Patient had ample opportunity for questions and discussion. All patient's questions were answered with full understanding. Strict return precautions discussed. Patient expresses understanding and agreement to plan.   Final Clinical Impressions(s) / ED Diagnoses   Final diagnoses:  Insect bite, unspecified site, initial encounter    ED Discharge Orders        Ordered    famotidine (PEPCID) 20 MG tablet  2 times daily     07/19/17 2336    diphenhydrAMINE (BENADRYL) 25 MG tablet  Every 6 hours     07/19/17 2336    predniSONE (DELTASONE) 20 MG tablet  Daily     07/19/17 2336    cephALEXin (KEFLEX) 500 MG capsule  4 times daily     07/19/17 2336       Maxwell Caul, PA-C 07/19/17 2343    Maxwell Caul, PA-C 07/19/17 2346    Glynn Octave, MD 07/20/17 [phone]

## 2017-07-19 NOTE — ED Provider Notes (Signed)
Patient placed in Quick Look pathway, seen and evaluated   Chief Complaint: insect bite  HPI:   Susan StainChristina Stanton is a 27 y.o. female who presents to the ED with redness and itching to the right upper arm where something bit her yesterday. Patient states she did not see what it was just felt the burn.  ROS: skin: insect bite, redness to left upper arm  Resp: no shortness of breath  Physical Exam:  BP 105/72 (BP Location: Left Arm)   Pulse 75   Temp 98.4 F (36.9 C) (Oral)   Resp 16   Ht 5\' 6"  (1.676 m)   Wt 81.6 kg (180 lb)   LMP 07/14/2017   SpO2 100%   BMI 29.05 kg/m     Gen: No distress  Neuro: Awake and Alert  Skin: area to right upper arm with central area that appears as insect bite with surrounding area of erythema. Slightly increased warmth.       Initiation of care has begun. The patient has been counseled on the process, plan, and necessity for staying for the completion/evaluation, and the remainder of the medical screening examination    Janne Napoleoneese, Tobechukwu Emmick M, NP 07/19/17 2043    Blane OharaZavitz, Joshua, MD 07/20/17 0010

## 2017-09-11 ENCOUNTER — Emergency Department (HOSPITAL_COMMUNITY)
Admission: EM | Admit: 2017-09-11 | Discharge: 2017-09-11 | Disposition: A | Discharge status: Home / Self Care | Payer: No Typology Code available for payment source | Attending: Emergency Medicine | Admitting: Emergency Medicine

## 2017-09-11 ENCOUNTER — Encounter (HOSPITAL_COMMUNITY): Payer: Self-pay | Admitting: Emergency Medicine

## 2017-09-11 ENCOUNTER — Other Ambulatory Visit: Payer: Self-pay

## 2017-09-11 DIAGNOSIS — Z041 Encounter for examination and observation following transport accident: Secondary | ICD-10-CM | POA: Insufficient documentation

## 2017-09-11 DIAGNOSIS — Z79899 Other long term (current) drug therapy: Secondary | ICD-10-CM | POA: Diagnosis not present
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
## 2017-09-11 NOTE — ED Notes (Signed)
Patient verbalized understanding of discharge instructions and denies any further needs or questions at this time. VS stable. Patient ambulatory with steady gait.  
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
## 2017-09-11 NOTE — Discharge Instructions (Addendum)
Please read attached information. If you experience any new or worsening signs or symptoms please return to the emergency room for evaluation. Please follow-up with your primary care provider or specialist as discussed.  °
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
## 2017-09-18 LAB — OB RESULTS CONSOLE ABO/RH: RH Type: POSITIVE

## 2017-09-18 LAB — OB RESULTS CONSOLE GC/CHLAMYDIA
Chlamydia: NEGATIVE
Gonorrhea: NEGATIVE

## 2017-09-18 LAB — OB RESULTS CONSOLE RUBELLA ANTIBODY, IGM: Rubella: IMMUNE

## 2017-09-18 LAB — OB RESULTS CONSOLE RPR: RPR: NONREACTIVE

## 2017-09-18 LAB — OB RESULTS CONSOLE HIV ANTIBODY (ROUTINE TESTING): HIV: NONREACTIVE

## 2017-09-18 LAB — OB RESULTS CONSOLE ANTIBODY SCREEN: Antibody Screen: NEGATIVE

## 2017-09-18 LAB — OB RESULTS CONSOLE HEPATITIS B SURFACE ANTIGEN: Hepatitis B Surface Ag: NEGATIVE

## 2017-09-20 LAB — HEMOGLOBIN A1C

## 2017-10-25 ENCOUNTER — Inpatient Hospital Stay (HOSPITAL_COMMUNITY)
Admission: AD | Admit: 2017-10-25 | Discharge: 2017-10-25 | Disposition: A | Discharge status: Home / Self Care | Payer: BLUE CROSS/BLUE SHIELD | Source: Ambulatory Visit | Attending: Obstetrics & Gynecology | Admitting: Obstetrics & Gynecology

## 2017-10-25 ENCOUNTER — Encounter (HOSPITAL_COMMUNITY): Payer: Self-pay | Admitting: *Deleted

## 2017-10-25 DIAGNOSIS — R05 Cough: Secondary | ICD-10-CM | POA: Diagnosis not present

## 2017-10-25 DIAGNOSIS — R109 Unspecified abdominal pain: Secondary | ICD-10-CM

## 2017-10-25 DIAGNOSIS — M7918 Myalgia, other site: Secondary | ICD-10-CM | POA: Insufficient documentation

## 2017-10-25 DIAGNOSIS — Y92481 Parking lot as the place of occurrence of the external cause: Secondary | ICD-10-CM | POA: Diagnosis not present

## 2017-10-25 DIAGNOSIS — R059 Cough, unspecified: Secondary | ICD-10-CM

## 2017-10-25 DIAGNOSIS — O26892 Other specified pregnancy related conditions, second trimester: Secondary | ICD-10-CM

## 2017-10-25 DIAGNOSIS — Z3A15 15 weeks gestation of pregnancy: Secondary | ICD-10-CM | POA: Insufficient documentation

## 2017-10-25 MED ORDER — BENZONATATE 100 MG PO CAPS: 200.0000 mg | ORAL_CAPSULE | Freq: Three times a day (TID) | ORAL | 0 refills | Status: DC | PRN

## 2017-10-25 NOTE — Progress Notes (Signed)
Sharen Counter CNM in earlier to see pt and discuss plan of care. Written and verbal d/c instructions given and understanding voiced
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
## 2017-11-04 ENCOUNTER — Ambulatory Visit (INDEPENDENT_AMBULATORY_CARE_PROVIDER_SITE_OTHER): Payer: BLUE CROSS/BLUE SHIELD | Admitting: Family Medicine

## 2017-11-04 ENCOUNTER — Encounter: Payer: Self-pay | Admitting: Family Medicine

## 2017-11-04 VITALS — BP 110/78 | HR 99 | Temp 98.2°F | Resp 16 | Wt 190.0 lb

## 2017-11-04 DIAGNOSIS — R0781 Pleurodynia: Secondary | ICD-10-CM | POA: Diagnosis not present

## 2017-11-04 NOTE — Patient Instructions (Signed)
I suspect your symptoms are related to a musculoskeletal etiology and will try conservative treatment.   Use ice or heat as tolerated on the area. You may want to try a topical pain medication such as Arnicare gel, Biofreeze, etc. You can ask the pharmacist for assistance if you would like.   You may also take Tylenol as needed for pain relief. I suspect your symptoms should improve gradually.
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
## 2017-11-13 ENCOUNTER — Telehealth: Payer: Self-pay | Admitting: Family Medicine

## 2017-11-13 NOTE — Telephone Encounter (Signed)
Received requested records from [facility]. Sending back for review.  °

## 2017-11-19 ENCOUNTER — Encounter: Payer: Self-pay | Admitting: Internal Medicine

## 2017-11-26 ENCOUNTER — Encounter: Payer: Self-pay | Admitting: Family Medicine

## 2017-11-26 ENCOUNTER — Ambulatory Visit (INDEPENDENT_AMBULATORY_CARE_PROVIDER_SITE_OTHER): Payer: BLUE CROSS/BLUE SHIELD | Admitting: Family Medicine

## 2017-11-26 VITALS — BP 120/80 | HR 80 | Wt 195.6 lb

## 2017-11-26 DIAGNOSIS — Z09 Encounter for follow-up examination after completed treatment for conditions other than malignant neoplasm: Secondary | ICD-10-CM | POA: Diagnosis not present

## 2017-11-26 DIAGNOSIS — R0781 Pleurodynia: Secondary | ICD-10-CM
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
## 2018-01-15 NOTE — L&D Delivery Note (Signed)
Delivery Note At 8:23 AM a viable female was delivered via Vaginal, Spontaneous (Presentation: direct OA).  APGAR: 8, 9; weight pending.   Placenta status: delivered spontaneously and completely.  Cord: three vessels with the following complications: n/a.   Anesthesia:  Epidural Episiotomy: None Lacerations: None Suture Repair: n/a Est. Blood Loss (mL): 100  Mom to postpartum.  Baby to Couplet care / Skin to Skin.  Janeece Riggers 04/10/2018, 9:11 AM

## 2018-02-04 ENCOUNTER — Other Ambulatory Visit (HOSPITAL_COMMUNITY): Payer: Self-pay | Admitting: Obstetrics & Gynecology

## 2018-02-04 DIAGNOSIS — O403XX0 Polyhydramnios, third trimester, not applicable or unspecified: Secondary | ICD-10-CM

## 2018-02-11 ENCOUNTER — Ambulatory Visit (HOSPITAL_COMMUNITY)
Admission: RE | Admit: 2018-02-11 | Discharge: 2018-02-11 | Disposition: A | Discharge status: Home / Self Care | Payer: BLUE CROSS/BLUE SHIELD | Source: Ambulatory Visit | Attending: Obstetrics & Gynecology | Admitting: Obstetrics & Gynecology

## 2018-02-11 DIAGNOSIS — Z3A31 31 weeks gestation of pregnancy: Secondary | ICD-10-CM | POA: Diagnosis not present

## 2018-02-11 DIAGNOSIS — O403XX0 Polyhydramnios, third trimester, not applicable or unspecified: Secondary | ICD-10-CM

## 2018-02-18 ENCOUNTER — Other Ambulatory Visit (HOSPITAL_COMMUNITY): Payer: Self-pay | Admitting: Obstetrics and Gynecology

## 2018-02-18 DIAGNOSIS — O403XX0 Polyhydramnios, third trimester, not applicable or unspecified: Secondary | ICD-10-CM

## 2018-02-24 ENCOUNTER — Ambulatory Visit (HOSPITAL_COMMUNITY)
Admission: RE | Admit: 2018-02-24 | Discharge: 2018-02-24 | Disposition: A | Discharge status: Home / Self Care | Payer: BLUE CROSS/BLUE SHIELD | Source: Ambulatory Visit | Attending: Obstetrics and Gynecology | Admitting: Obstetrics and Gynecology

## 2018-02-24 DIAGNOSIS — Z3A33 33 weeks gestation of pregnancy: Secondary | ICD-10-CM

## 2018-02-24 DIAGNOSIS — O403XX0 Polyhydramnios, third trimester, not applicable or unspecified: Secondary | ICD-10-CM | POA: Diagnosis not present

## 2018-03-12 LAB — OB RESULTS CONSOLE GBS: GBS: NEGATIVE

## 2018-04-09 ENCOUNTER — Other Ambulatory Visit: Payer: Self-pay | Admitting: Obstetrics & Gynecology

## 2018-04-10 ENCOUNTER — Inpatient Hospital Stay (HOSPITAL_COMMUNITY): Payer: BLUE CROSS/BLUE SHIELD | Admitting: Anesthesiology

## 2018-04-10 ENCOUNTER — Encounter (HOSPITAL_COMMUNITY): Payer: Self-pay

## 2018-04-10 ENCOUNTER — Other Ambulatory Visit: Payer: Self-pay

## 2018-04-10 ENCOUNTER — Inpatient Hospital Stay (HOSPITAL_COMMUNITY): Payer: BLUE CROSS/BLUE SHIELD

## 2018-04-10 ENCOUNTER — Inpatient Hospital Stay (HOSPITAL_COMMUNITY)
Admission: AD | Admit: 2018-04-10 | Discharge: 2018-04-11 | DRG: 807 | Disposition: A | Discharge status: Home / Self Care | Payer: BLUE CROSS/BLUE SHIELD | Attending: Obstetrics and Gynecology | Admitting: Obstetrics and Gynecology

## 2018-04-10 DIAGNOSIS — Z3A39 39 weeks gestation of pregnancy: Secondary | ICD-10-CM | POA: Diagnosis not present

## 2018-04-10 DIAGNOSIS — O43123 Velamentous insertion of umbilical cord, third trimester: Secondary | ICD-10-CM | POA: Diagnosis present

## 2018-04-10 DIAGNOSIS — O403XX0 Polyhydramnios, third trimester, not applicable or unspecified: Secondary | ICD-10-CM | POA: Diagnosis present

## 2018-04-10 LAB — CBC
HCT: 33.7 % — ABNORMAL LOW (ref 36.0–46.0)
Hemoglobin: 10.9 g/dL — ABNORMAL LOW (ref 12.0–15.0)
MCH: 27.3 pg (ref 26.0–34.0)
MCHC: 32.3 g/dL (ref 30.0–36.0)
MCV: 84.5 fL (ref 80.0–100.0)
NRBC: 0 % (ref 0.0–0.2)
Platelets: 186 10*3/uL (ref 150–400)
RBC: 3.99 MIL/uL (ref 3.87–5.11)
RDW: 13.8 % (ref 11.5–15.5)
WBC: 10.4 10*3/uL (ref 4.0–10.5)

## 2018-04-10 LAB — RPR: RPR Ser Ql: NONREACTIVE

## 2018-04-10 LAB — TYPE AND SCREEN
ABO/RH(D): A POS
ANTIBODY SCREEN: NEGATIVE

## 2018-04-10 LAB — ABO/RH: ABO/RH(D): A POS

## 2018-04-10 MED ORDER — COCONUT OIL OIL: 1.0000 "application " | TOPICAL_OIL | Status: DC | PRN

## 2018-04-10 MED ORDER — DIBUCAINE 1 % RE OINT: 1.0000 "application " | TOPICAL_OINTMENT | RECTAL | Status: DC | PRN

## 2018-04-10 MED ORDER — OXYTOCIN 40 UNITS IN NORMAL SALINE INFUSION - SIMPLE MED
2.5000 [IU]/h | INTRAVENOUS | Status: DC
  Administered 2018-04-10: 2.5 [IU]/h via INTRAVENOUS

## 2018-04-10 MED ORDER — DIPHENHYDRAMINE HCL 25 MG PO CAPS: 25.0000 mg | ORAL_CAPSULE | Freq: Four times a day (QID) | ORAL | Status: DC | PRN

## 2018-04-10 MED ORDER — SENNOSIDES-DOCUSATE SODIUM 8.6-50 MG PO TABS
2.0000 | ORAL_TABLET | ORAL | Status: DC
  Filled 2018-04-10: qty 2

## 2018-04-10 MED ORDER — OXYTOCIN BOLUS FROM INFUSION
500.0000 mL | Freq: Once | INTRAVENOUS | Status: AC
  Administered 2018-04-10: 500 mL via INTRAVENOUS

## 2018-04-10 MED ORDER — LACTATED RINGERS IV SOLN
INTRAVENOUS | Status: DC
  Administered 2018-04-10 (×2): via INTRAVENOUS

## 2018-04-10 MED ORDER — SODIUM CHLORIDE (PF) 0.9 % IJ SOLN
INTRAMUSCULAR | Status: DC | PRN
  Administered 2018-04-10: 12 mL/h via EPIDURAL

## 2018-04-10 MED ORDER — ACETAMINOPHEN 325 MG PO TABS: 650.0000 mg | ORAL_TABLET | ORAL | Status: DC | PRN

## 2018-04-10 MED ORDER — ONDANSETRON HCL 4 MG PO TABS: 4.0000 mg | ORAL_TABLET | ORAL | Status: DC | PRN

## 2018-04-10 MED ORDER — MISOPROSTOL 25 MCG QUARTER TABLET
25.0000 ug | ORAL_TABLET | ORAL | Status: DC | PRN
  Filled 2018-04-10: qty 1

## 2018-04-10 MED ORDER — LIDOCAINE-EPINEPHRINE (PF) 2 %-1:200000 IJ SOLN
INTRAMUSCULAR | Status: DC | PRN
  Administered 2018-04-10: 3 mL via EPIDURAL
  Administered 2018-04-10: 4 mL via EPIDURAL

## 2018-04-10 MED ORDER — EPHEDRINE 5 MG/ML INJ: 10.0000 mg | INTRAVENOUS | Status: DC | PRN

## 2018-04-10 MED ORDER — TERBUTALINE SULFATE 1 MG/ML IJ SOLN: 0.2500 mg | Freq: Once | INTRAMUSCULAR | Status: DC | PRN

## 2018-04-10 MED ORDER — ACETAMINOPHEN 325 MG PO TABS
650.0000 mg | ORAL_TABLET | ORAL | Status: DC | PRN
  Filled 2018-04-10: qty 2

## 2018-04-10 MED ORDER — SOD CITRATE-CITRIC ACID 500-334 MG/5ML PO SOLN: 30.0000 mL | ORAL | Status: DC | PRN

## 2018-04-10 MED ORDER — ONDANSETRON HCL 4 MG/2ML IJ SOLN: 4.0000 mg | INTRAMUSCULAR | Status: DC | PRN

## 2018-04-10 MED ORDER — PRENATAL MULTIVITAMIN CH
1.0000 | ORAL_TABLET | Freq: Every day | ORAL | Status: DC
  Administered 2018-04-10 – 2018-04-11 (×2): 1 via ORAL
  Filled 2018-04-10 (×2): qty 1

## 2018-04-10 MED ORDER — IBUPROFEN 600 MG PO TABS
600.0000 mg | ORAL_TABLET | Freq: Four times a day (QID) | ORAL | Status: DC
  Administered 2018-04-10 – 2018-04-11 (×5): 600 mg via ORAL
  Filled 2018-04-10 (×5): qty 1

## 2018-04-10 MED ORDER — FENTANYL-BUPIVACAINE-NACL 0.5-0.125-0.9 MG/250ML-% EP SOLN
12.0000 mL/h | EPIDURAL | Status: DC | PRN
  Filled 2018-04-10: qty 250

## 2018-04-10 MED ORDER — LIDOCAINE HCL (PF) 1 % IJ SOLN: 30.0000 mL | INTRAMUSCULAR | Status: DC | PRN

## 2018-04-10 MED ORDER — BENZOCAINE-MENTHOL 20-0.5 % EX AERO: 1.0000 "application " | INHALATION_SPRAY | CUTANEOUS | Status: DC | PRN

## 2018-04-10 MED ORDER — OXYTOCIN 40 UNITS IN NORMAL SALINE INFUSION - SIMPLE MED
1.0000 m[IU]/min | INTRAVENOUS | Status: DC
  Filled 2018-04-10: qty 1000

## 2018-04-10 MED ORDER — LACTATED RINGERS IV SOLN: 500.0000 mL | Freq: Once | INTRAVENOUS | Status: DC

## 2018-04-10 MED ORDER — TETANUS-DIPHTH-ACELL PERTUSSIS 5-2.5-18.5 LF-MCG/0.5 IM SUSP
0.5000 mL | Freq: Once | INTRAMUSCULAR | Status: DC
  Filled 2018-04-10: qty 0.5

## 2018-04-10 MED ORDER — FLEET ENEMA 7-19 GM/118ML RE ENEM: 1.0000 | ENEMA | RECTAL | Status: DC | PRN

## 2018-04-10 MED ORDER — WITCH HAZEL-GLYCERIN EX PADS: 1.0000 "application " | MEDICATED_PAD | CUTANEOUS | Status: DC | PRN

## 2018-04-10 MED ORDER — PHENYLEPHRINE 40 MCG/ML (10ML) SYRINGE FOR IV PUSH (FOR BLOOD PRESSURE SUPPORT)
80.0000 ug | PREFILLED_SYRINGE | INTRAVENOUS | Status: DC | PRN
  Filled 2018-04-10: qty 10

## 2018-04-10 MED ORDER — DIPHENHYDRAMINE HCL 50 MG/ML IJ SOLN: 12.5000 mg | INTRAMUSCULAR | Status: DC | PRN

## 2018-04-10 MED ORDER — SIMETHICONE 80 MG PO CHEW: 80.0000 mg | CHEWABLE_TABLET | ORAL | Status: DC | PRN

## 2018-04-10 MED ORDER — PHENYLEPHRINE 40 MCG/ML (10ML) SYRINGE FOR IV PUSH (FOR BLOOD PRESSURE SUPPORT): 80.0000 ug | PREFILLED_SYRINGE | INTRAVENOUS | Status: DC | PRN

## 2018-04-10 MED ORDER — LORATADINE 10 MG PO TABS
10.0000 mg | ORAL_TABLET | Freq: Every day | ORAL | Status: DC
  Administered 2018-04-10 – 2018-04-11 (×2): 10 mg via ORAL
  Filled 2018-04-10 (×2): qty 1

## 2018-04-10 MED ORDER — LACTATED RINGERS IV SOLN
500.0000 mL | INTRAVENOUS | Status: DC | PRN
  Administered 2018-04-10 (×2): 500 mL via INTRAVENOUS

## 2018-04-10 MED ORDER — OXYTOCIN 40 UNITS IN NORMAL SALINE INFUSION - SIMPLE MED
1.0000 m[IU]/min | INTRAVENOUS | Status: DC
  Administered 2018-04-10: 1 m[IU]/min via INTRAVENOUS

## 2018-04-10 MED ORDER — ONDANSETRON HCL 4 MG/2ML IJ SOLN: 4.0000 mg | Freq: Four times a day (QID) | INTRAMUSCULAR | Status: DC | PRN

## 2018-04-10 MED ORDER — BENZONATATE 100 MG PO CAPS
100.0000 mg | ORAL_CAPSULE | Freq: Three times a day (TID) | ORAL | Status: DC
  Administered 2018-04-10 – 2018-04-11 (×3): 100 mg via ORAL
  Filled 2018-04-10 (×5): qty 1

## 2018-04-10 MED ORDER — ZOLPIDEM TARTRATE 5 MG PO TABS: 5.0000 mg | ORAL_TABLET | Freq: Every evening | ORAL | Status: DC | PRN

## 2018-04-10 MED ORDER — FENTANYL CITRATE (PF) 100 MCG/2ML IJ SOLN: 50.0000 ug | INTRAMUSCULAR | Status: DC | PRN

## 2018-04-10 NOTE — Progress Notes (Signed)
L&D nurse brought patient up to mother baby with no mask. She stated that mother had a cough on admission but no other symptoms. Infectious disease ordered for patient to wear a mask during labor. After labor patient was not coughing so they took the mask off the patient.  When I questioned the L&D nurse, I was told the patient did not need the mask any longer.

## 2018-04-10 NOTE — Anesthesia Procedure Notes (Signed)
Epidural Patient location during procedure: OB Start time: 04/10/2018 3:20 AM End time: 04/10/2018 3:35 AM  Staffing Anesthesiologist: Elmer Picker, MD Performed: anesthesiologist   Preanesthetic Checklist Completed: patient identified, pre-op evaluation, timeout performed, IV checked, risks and benefits discussed and monitors and equipment checked  Epidural Patient position: sitting Prep: site prepped and draped and DuraPrep Patient monitoring: continuous pulse ox, blood pressure, heart rate and cardiac monitor Approach: midline Location: L3-L4 Injection technique: LOR air  Needle:  Needle type: Tuohy  Needle gauge: 17 G Needle length: 9 cm Needle insertion depth: 6 cm Catheter type: closed end flexible Catheter size: 19 Gauge Catheter at skin depth: 12 cm Test dose: negative  Assessment Sensory level: T8 Events: blood not aspirated, injection not painful, no injection resistance, negative IV test and no paresthesia  Additional Notes Patient identified. Risks/Benefits/Options discussed with patient including but not limited to bleeding, infection, nerve damage, paralysis, failed block, incomplete pain control, headache, blood pressure changes, nausea, vomiting, reactions to medication both or allergic, itching and postpartum back pain. Confirmed with bedside nurse the patient's most recent platelet count. Confirmed with patient that they are not currently taking any anticoagulation, have any bleeding history or any family history of bleeding disorders. Patient expressed understanding and wished to proceed. All questions were answered. Sterile technique was used throughout the entire procedure. Please see nursing notes for vital signs. Test dose was given through epidural catheter and negative prior to continuing to dose epidural or start infusion. Warning signs of high block given to the patient including shortness of breath, tingling/numbness in hands, complete motor block,  or any concerning symptoms with instructions to call for help. Patient was given instructions on fall risk and not to get out of bed. All questions and concerns addressed with instructions to call with any issues or inadequate analgesia.  Reason for block:procedure for pain

## 2018-04-10 NOTE — Progress Notes (Signed)
Subjective: Pt comfortable after epidural  Objective: BP 106/64   Pulse 89   Temp 98.1 F (36.7 C) (Oral)   Resp 16   Ht 5\' 6"  (1.676 m)   Wt 97.8 kg   LMP 07/14/2017   SpO2 99%   BMI 34.80 kg/m  No intake/output data recorded. No intake/output data recorded.  FHT: Category 1 FHT 130 accels no decels UC:   regular, every 2-4 minutes SVE:   Dilation: 6 Effacement (%): 70 Station: -2 Exam by:: Bernerd Pho, CNM Pitocin at 23mu AROM clear fluid, IUPC placed  Assessment:  IUP at 39.3  Induction for polyhydraminos Cat 1 strip  Plan: Pitocin augmentation Anticipate SVD  Kenney Houseman CNM, MSN 04/10/2018, 7:05 AM

## 2018-04-10 NOTE — Anesthesia Preprocedure Evaluation (Signed)
Anesthesia Evaluation  Patient identified by MRN, date of birth, ID band Patient awake    Reviewed: Allergy & Precautions, NPO status , Patient's Chart, lab work & pertinent test results  Airway Mallampati: II  TM Distance: >3 FB Neck ROM: Full    Dental no notable dental hx.    Pulmonary neg pulmonary ROS,    Pulmonary exam normal breath sounds clear to auscultation       Cardiovascular negative cardio ROS Normal cardiovascular exam Rhythm:Regular Rate:Normal     Neuro/Psych PSYCHIATRIC DISORDERS Anxiety negative neurological ROS     GI/Hepatic negative GI ROS, Neg liver ROS,   Endo/Other  negative endocrine ROS  Renal/GU negative Renal ROS  negative genitourinary   Musculoskeletal negative musculoskeletal ROS (+)   Abdominal   Peds negative pediatric ROS (+)  Hematology  (+) Blood dyscrasia, anemia ,   Anesthesia Other Findings   Reproductive/Obstetrics (+) Pregnancy                             Anesthesia Physical Anesthesia Plan  ASA: II  Anesthesia Plan: Epidural   Post-op Pain Management:    Induction:   PONV Risk Score and Plan: Treatment may vary due to age or medical condition  Airway Management Planned: Natural Airway  Additional Equipment:   Intra-op Plan:   Post-operative Plan:   Informed Consent: I have reviewed the patients History and Physical, chart, labs and discussed the procedure including the risks, benefits and alternatives for the proposed anesthesia with the patient or authorized representative who has indicated his/her understanding and acceptance.       Plan Discussed with: Anesthesiologist  Anesthesia Plan Comments: (Patient identified. Risks, benefits, options discussed with patient including but not limited to bleeding, infection, nerve damage, paralysis, failed block, incomplete pain control, headache, blood pressure changes, nausea,  vomiting, reactions to medication, itching, and post partum back pain. Confirmed with bedside nurse the patient's most recent platelet count. Confirmed with the patient that they are not taking any anticoagulation, have any bleeding history or any family history of bleeding disorders. Patient expressed understanding and wishes to proceed. All questions were answered. )        Anesthesia Quick Evaluation

## 2018-04-10 NOTE — Progress Notes (Signed)
Spoke to infectious disease per patient previous order to wear a mask and patient's cough and temperature.  Advised infectious disease that patient's temperature had increased from 98.6 to 99.6 throughout the day.  Patient reports not having a cough.  Infectious disease advised to have patient wear a mask while breastfeeding infant, put a clean blanket between mother and infant during feeding or holding infant, and to monitor patient's temperatures and respiratory assessments. Notify MD of any changes.

## 2018-04-10 NOTE — H&P (Signed)
Susan Stanton is a 28 y.o. female, G2P1001 at 39.3 weeks, presenting for induction of labor for polyhydraminos.  Prenatal hx remarkable for marginal insertion of cord.  FM+  IBOW.  Occ contractions  Patient Active Problem List   Diagnosis Date Noted  . Polyhydramnios affecting pregnancy in third trimester 04/10/2018    History of present pregnancy: Patient entered care at 13.1 weeks.   EDC of 04/14/2018 was established by LM)P.   Anatomy scan:  20 weeks, with normal findings and an anterior placenta with marginal insertion of cord Additional [place] evaluations:  growth.  28 weeks 43%, 30 week afi increased  Significant prenatal events:  Last AFI  23.7 at 38 weeks. Last evaluation:  Last week  OB History    Gravida  2   Para  1   Term  1   Preterm      AB      Living  1     SAB      TAB      Ectopic      Multiple      Live Births  1          Past Medical History:  Diagnosis Date  . Anxiety    takes Valium prn  . Back pain    scoliosis  . No pertinent past medical history    Past Surgical History:  Procedure Laterality Date  . CARPAL TUNNEL RELEASE  07/02/2011   Procedure: CARPAL TUNNEL RELEASE;  Surgeon: Marlowe Shores, MD;  Location: [facility] OR;  Service: Orthopedics;  Laterality: Right;  . CARPAL TUNNEL RELEASE Right    Right arm; metal plates and screws   Family History: family history includes Diabetes in her father; Epilepsy in her brother; Hyperlipidemia in her father; Hypertension in her father; Scoliosis in her mother. Social History:  reports that she has never smoked. She has never used smokeless tobacco. She reports current alcohol use. She reports that she does not use drugs.   Prenatal Transfer Tool  Maternal Diabetes: No Genetic Screening: Normal Maternal Ultrasounds/Referrals: Normal Fetal Ultrasounds or other Referrals:  None Maternal Substance Abuse:  No Significant Maternal Medications:  None Significant Maternal Lab Results:  None  TDAP UTD Flu UTD  ROS:  All 10 systems reviewed and negative except as stated above  Allergies  Allergen Reactions  . Hydrocodone Itching    And headache   . Oxycodone Itching    And headache        Blood pressure 108/78, pulse 98, temperature (!) 97.5 F (36.4 C), temperature source Oral, resp. rate 16, height 5\' 6"  (1.676 m), weight 97.8 kg, last menstrual period 07/14/2017.  Chest clear Heart RRR without murmur Abd gravid, NT, FH increased Pelvic:2/50/-1 Ext: Negative  FHR: Category 1 FHT 145 accels noted no decels UCs:  irregular  Prenatal labs: ABO, Rh: A/Positive/-- (09/04 0000) Antibody: Negative (09/04 0000) Rubella:  Immune (09/04 0000) RPR: Nonreactive (09/04 0000)  HBsAg: Negative (09/04 0000)  HIV: Non-reactive (09/04 0000)  GBS: Negative (02/26 0000) Sickle cell/Hgb electrophoresis:  AA GC:  Neg Chlamydia: Neg Genetic screenings:  NIPT wnl Glucola:  91 Other:   Hgb 12.1 at 28 weeks   Assessment/Plan: IUP at 39.3  Induction for polyhydraminos Cat 1 strip  Plan: Admit to Birthing Suite  Routine [facility] orders Pain med/epidural prn Foley bulb insertion Pitocin augmentation if needed  Henderson Newcomer ProtheroCNM, MSN 04/10/2018, 12:37 AM

## 2018-04-11 LAB — CBC
HCT: 32.5 % — ABNORMAL LOW (ref 36.0–46.0)
Hemoglobin: 10.4 g/dL — ABNORMAL LOW (ref 12.0–15.0)
MCH: 27.2 pg (ref 26.0–34.0)
MCHC: 32 g/dL (ref 30.0–36.0)
MCV: 85.1 fL (ref 80.0–100.0)
PLATELETS: 157 10*3/uL (ref 150–400)
RBC: 3.82 MIL/uL — ABNORMAL LOW (ref 3.87–5.11)
RDW: 14.1 % (ref 11.5–15.5)
WBC: 9.2 10*3/uL (ref 4.0–10.5)
nRBC: 0 % (ref 0.0–0.2)

## 2018-04-11 MED ORDER — IBUPROFEN 600 MG PO TABS: 600.0000 mg | ORAL_TABLET | Freq: Four times a day (QID) | ORAL | 0 refills | Status: DC | PRN

## 2018-04-11 NOTE — Discharge Instructions (Signed)
Postpartum Care After Vaginal Delivery ° °The period of time right after you deliver your newborn is called the postpartum period. °What kind of medical care will I receive? °· You may continue to receive fluids and medicines through an IV tube inserted into one of your veins. °· If an incision was made near your vagina (episiotomy) or if you had some vaginal tearing during delivery, cold compresses may be placed on your episiotomy or your tear. This helps to reduce pain and swelling. °· You may be given a squirt bottle to use when you go to the bathroom. You may use this until you are comfortable wiping as usual. To use the squirt bottle, follow these steps: °? Before you urinate, fill the squirt bottle with warm water. Do not use hot water. °? After you urinate, while you are sitting on the toilet, use the squirt bottle to rinse the area around your urethra and vaginal opening. This rinses away any urine and blood. °? You may do this instead of wiping. As you start healing, you may use the squirt bottle before wiping yourself. Make sure to wipe gently. °? Fill the squirt bottle with clean water every time you use the bathroom. °· You will be given sanitary pads to wear. °How can I expect to feel? °· You may not feel the need to urinate for several hours after delivery. °· You will have some soreness and pain in your abdomen and vagina. °· If you are breastfeeding, you may have uterine contractions every time you breastfeed for up to several weeks postpartum. Uterine contractions help your uterus return to its normal size. °· It is normal to have vaginal bleeding (lochia) after delivery. The amount and appearance of lochia is often similar to a menstrual period in the first week after delivery. It will gradually decrease over the next few weeks to a dry, yellow-brown discharge. For most women, lochia stops completely by 6-8 weeks after delivery. Vaginal bleeding can vary from woman to woman. °· Within the first few  days after delivery, you may have breast engorgement. This is when your breasts feel heavy, full, and uncomfortable. Your breasts may also throb and feel hard, tightly stretched, warm, and tender. After this occurs, you may have milk leaking from your breasts. Your health care provider can help you relieve discomfort due to breast engorgement. Breast engorgement should go away within a few days. °· You may feel more sad or worried than normal due to hormonal changes after delivery. These feelings should not last more than a few days. If these feelings do not go away after several days, speak with your health care provider. °How should I care for myself? °· Tell your health care provider if you have pain or discomfort. °· Drink enough water to keep your urine clear or pale yellow. °· Wash your hands thoroughly with soap and water for at least 20 seconds after changing your sanitary pads, after using the toilet, and before holding or feeding your baby. °· If you are not breastfeeding, avoid touching your breasts a lot. Doing this can make your breasts produce more milk. °· If you become weak or lightheaded, or you feel like you might faint, ask for help before: °? Getting out of bed. °? Showering. °· Change your sanitary pads frequently. Watch for any changes in your flow, such as a sudden increase in volume, a change in color, the passing of large blood clots. If you pass a blood clot from your vagina,   save it to show to your health care provider. Do not flush blood clots down the toilet without having your health care provider look at them. °· Make sure that all your vaccinations are up to date. This can help protect you and your baby from getting certain diseases. You may need to have immunizations done before you leave the hospital. °· If desired, talk with your health care provider about methods of family planning or birth control (contraception). °How can I start bonding with my baby? °Spending as much time as  possible with your baby is very important. During this time, you and your baby can get to know each other and develop a bond. Having your baby stay with you in your room (rooming in) can give you time to get to know your baby. Rooming in can also help you become comfortable caring for your baby. Breastfeeding can also help you bond with your baby. °How can I plan for returning home with my baby? °· Make sure that you have a car seat installed in your vehicle. °? Your car seat should be checked by a certified car seat installer to make sure that it is installed safely. °? Make sure that your baby fits into the car seat safely. °· Ask your health care provider any questions you have about caring for yourself or your baby. Make sure that you are able to contact your health care provider with any questions after leaving the hospital. °This information is not intended to replace advice given to you by your health care provider. Make sure you discuss any questions you have with your health care provider. °Document Released: 10/29/2006 Document Revised: 06/06/2015 Document Reviewed: 12/06/2014 °Elsevier Interactive Patient Education © 2018 [employer] ° ° °Postpartum Depression and Baby Blues °The postpartum period begins right after the birth of a baby. During this time, there is often a great amount of joy and excitement. It is also a time of many changes in the life of the parents. Regardless of how many times a mother gives birth, each child brings new challenges and dynamics to the family. It is not unusual to have feelings of excitement along with confusing shifts in moods, emotions, and thoughts. All mothers are at risk of developing postpartum depression or the "baby blues." These mood changes can occur right after giving birth, or they may occur many months after giving birth. The baby blues or postpartum depression can be mild or severe. Additionally, postpartum depression can go away rather quickly, or it can  be a long-term condition. °What are the causes? °Raised hormone levels and the rapid drop in those levels are thought to be a main cause of postpartum depression and the baby blues. A number of hormones change during and after pregnancy. Estrogen and progesterone usually decrease right after the delivery of your baby. The levels of thyroid hormone and various cortisol steroids also rapidly drop. Other factors that play a role in these mood changes include major life events and genetics. °What increases the risk? °If you have any of the following risks for the baby blues or postpartum depression, know what symptoms to watch out for during the postpartum period. Risk factors that may increase the likelihood of getting the baby blues or postpartum depression include: °· Having a personal or family history of depression. °· Having depression while being pregnant. °· Having premenstrual mood issues or mood issues related to oral contraceptives. °· Having a lot of life stress. °· Having marital conflict. °· Lacking   a social support network. °· Having a baby with special needs. °· Having health problems, such as diabetes. ° °What are the signs or symptoms? °Symptoms of baby blues include: °· Brief changes in mood, such as going from extreme happiness to sadness. °· Decreased concentration. °· Difficulty sleeping. °· Crying spells, tearfulness. °· Irritability. °· Anxiety. ° °Symptoms of postpartum depression typically begin within the first month after giving birth. These symptoms include: °· Difficulty sleeping or excessive sleepiness. °· Marked weight loss. °· Agitation. °· Feelings of worthlessness. °· Lack of interest in activity or food. ° °Postpartum psychosis is a very serious condition and can be dangerous. Fortunately, it is rare. Displaying any of the following symptoms is cause for immediate medical attention. Symptoms of postpartum psychosis include: °· Hallucinations and delusions. °· Bizarre or disorganized  behavior. °· Confusion or disorientation. ° °How is this diagnosed? °A diagnosis is made by an evaluation of your symptoms. There are no medical or lab tests that lead to a diagnosis, but there are various questionnaires that a health care provider may use to identify those with the baby blues, postpartum depression, or psychosis. Often, a screening tool called the Edinburgh Postnatal Depression Scale is used to diagnose depression in the postpartum period. °How is this treated? °The baby blues usually goes away on its own in 1-2 weeks. Social support is often all that is needed. You will be encouraged to get adequate sleep and rest. Occasionally, you may be given medicines to help you sleep. °Postpartum depression requires treatment because it can last several months or longer if it is not treated. Treatment may include individual or group therapy, medicine, or both to address any social, physiological, and psychological factors that may play a role in the depression. Regular exercise, a healthy diet, rest, and social support may also be strongly recommended. °Postpartum psychosis is more serious and needs treatment right away. Hospitalization is often needed. °Follow these instructions at home: °· Get as much rest as you can. Nap when the baby sleeps. °· Exercise regularly. Some women find yoga and walking to be beneficial. °· Eat a balanced and nourishing diet. °· Do little things that you enjoy. Have a cup of tea, take a bubble bath, read your favorite magazine, or listen to your favorite music. °· Avoid alcohol. °· Ask for help with household chores, cooking, grocery shopping, or running errands as needed. Do not try to do everything. °· Talk to people close to you about how you are feeling. Get support from your partner, family members, friends, or other new moms. °· Try to stay positive in how you think. Think about the things you are grateful for. °· Do not spend a lot of time alone. °· Only take  over-the-counter or prescription medicine as directed by your health care provider. °· Keep all your postpartum appointments. °· Let your health care provider know if you have any concerns. °Contact a health care provider if: °You are having a reaction to or problems with your medicine. °Get help right away if: °· You have suicidal feelings. °· You think you may harm the baby or someone else. °This information is not intended to replace advice given to you by your health care provider. Make sure you discuss any questions you have with your health care provider. °Document Released: 10/06/2003 Document Revised: 06/09/2015 Document Reviewed: 10/13/2012 °Elsevier Interactive Patient Education © 2017 [employer] ° ° °

## 2018-04-11 NOTE — Anesthesia Postprocedure Evaluation (Signed)
Anesthesia Post Note  Patient: Susan Stanton  Procedure(s) Performed: AN AD HOC LABOR EPIDURAL     Patient location during evaluation: Mother Baby Anesthesia Type: Epidural Level of consciousness: awake Pain management: satisfactory to patient Vital Signs Assessment: post-procedure vital signs reviewed and stable Respiratory status: spontaneous breathing Cardiovascular status: stable Anesthetic complications: no    Last Vitals:  Vitals:   04/11/18 0100 04/11/18 0540  BP: 124/80 104/68  Pulse: 83 78  Resp: 16 16  Temp: 36.7 C 36.8 C  SpO2:  97%    Last Pain:  Vitals:   04/11/18 0540  TempSrc: Oral  PainSc:    Pain Goal:                   [employer]

## 2018-04-11 NOTE — Discharge Summary (Signed)
OB Discharge Summary     Patient Name: Susan Stanton DOB: Feb 06, 1990 MRN: [id number]  Date of admission: 04/10/2018 Delivering MD: Janeece Riggers   Date of discharge: 04/11/2018  Admitting diagnosis: No admission diagnoses are documented for this encounter. Intrauterine pregnancy: [redacted]w[redacted]d     Secondary diagnosis:  Active Problems:   Polyhydramnios affecting pregnancy in third trimester      Discharge diagnosis: Term Pregnancy Delivered                                                                                                Post partum procedures:n/a  Augmentation: AROM, Pitocin and Foley Balloon  Complications: None  Hospital course:  Induction of Labor With Vaginal Delivery   28 y.o. yo G2P2002 at [redacted]w[redacted]d was admitted to the hospital 04/10/2018 for induction of labor.  Indication for induction: polyhydramnios.  Patient had an uncomplicated labor course as follows: Membrane Rupture Time/Date: 6:42 AM ,04/10/2018   Intrapartum Procedures: Episiotomy: None [1]                                         Lacerations:  None [1]  Patient had delivery of a Viable infant.  Information for the patient's newborn:  [place], [place] [[id number]]  Delivery Method: Vaginal, Spontaneous(Filed from Delivery Summary)   04/10/2018  Details of delivery can be found in separate delivery note.  Patient had a routine postpartum course. Patient is discharged home 04/11/18.  Physical exam  Vitals:   04/10/18 1658 04/10/18 2108 04/11/18 0100 04/11/18 0540  BP: 110/68 108/73 124/80 104/68  Pulse: 75 82 83 78  Resp:  16 16 16   Temp: 99.6 F (37.6 C) 98.3 F (36.8 C) 98.1 F (36.7 C) 98.3 F (36.8 C)  TempSrc: Oral Oral Oral Oral  SpO2: 98% 98%  97%  Weight:      Height:       General: alert, cooperative and no distress, mood stable without symptoms of anxiety  Lochia: appropriate Uterine Fundus: firm Incision: N/A DVT Evaluation: No evidence of DVT seen on physical  exam. Negative Homan's sign. No cords or calf tenderness. No significant calf/ankle edema.  Labs: Lab Results  Component Value Date   WBC 9.2 04/11/2018   HGB 10.4 (L) 04/11/2018   HCT 32.5 (L) 04/11/2018   MCV 85.1 04/11/2018   PLT 157 04/11/2018   CMP Latest Ref Rng & Units 03/07/2017  Glucose 65 - 99 mg/dL 93  BUN 6 - 20 mg/dL 9  Creatinine 7.71 - 1.65 mg/dL 7.90  Sodium 383 - 338 mmol/L 141  Potassium 3.5 - 5.2 mmol/L 4.4  Chloride 96 - 106 mmol/L 102  CO2 20 - 29 mmol/L 24  Calcium 8.7 - 10.2 mg/dL 8.8  Total Protein 6.0 - 8.5 g/dL 6.4  Total Bilirubin 0.0 - 1.2 mg/dL 0.4  Alkaline Phos 39 - 117 IU/L 63  AST 0 - 40 IU/L 16  ALT 0 - 32 IU/L 13    Discharge instruction: per After Visit  Summary and "Baby and Me Booklet".  After visit meds:  Allergies as of 04/11/2018      Reactions   Hydrocodone Itching   And headache    Oxycodone Itching   And headache       Medication List    TAKE these medications   ibuprofen 600 MG tablet Commonly known as:  ADVIL,MOTRIN Take 1 tablet (600 mg total) by mouth every 6 (six) hours as needed for moderate pain.   PRE-NATAL FORMULA PO Take 1 tablet by mouth daily.       Diet: routine diet  Activity: Advance as tolerated. Pelvic rest for 6 weeks.   Outpatient follow up:6 weeks Follow up Appt:No future appointments. Follow up Visit:No follow-ups on file.  Postpartum contraception: Undecided  Newborn Data: Live born female  Birth Weight: 8 lb 0.8 oz (3651 g) APGAR: 8, 9  Newborn Delivery   Birth date/time:  04/10/2018 08:23:00 Delivery type:  Vaginal, Spontaneous     Baby Feeding: Breast Disposition:home with mother   04/11/2018 Janeece Riggers, CNM

## 2018-04-11 NOTE — Progress Notes (Signed)
MOB was referred for history of anxiety.  * Referral screened out by Clinical Social Worker because none of the following criteria appear to apply:  -History of anxiety/depression during this pregnancy, or of post-partum depression following prior delivery. -Diagnosis of anxiety and/or depression within last 3 years OR * MOB's symptoms currently being treated with medication and/or therapy.  Please contact the Clinical Social Worker if needs arise, by MOB request, or if MOB scores greater than 9/yes to question 10 on Edinburgh Postpartum Depression Screen.  Sereniti Wan Irwin, LCSWA  [facility] [phone]  

## 2018-12-21 IMAGING — DX DG WRIST COMPLETE 3+V*R*
4 series · 4 of 4 positions shown · non-contrast
Comparison: Right wrist radiographs 03/14/2012.

CLINICAL DATA: Fall last night. Pain along radial side of wrist.
Previous ORIF 1404.

EXAM:
RIGHT WRIST - COMPLETE 3+ VIEW

[wrist pa]
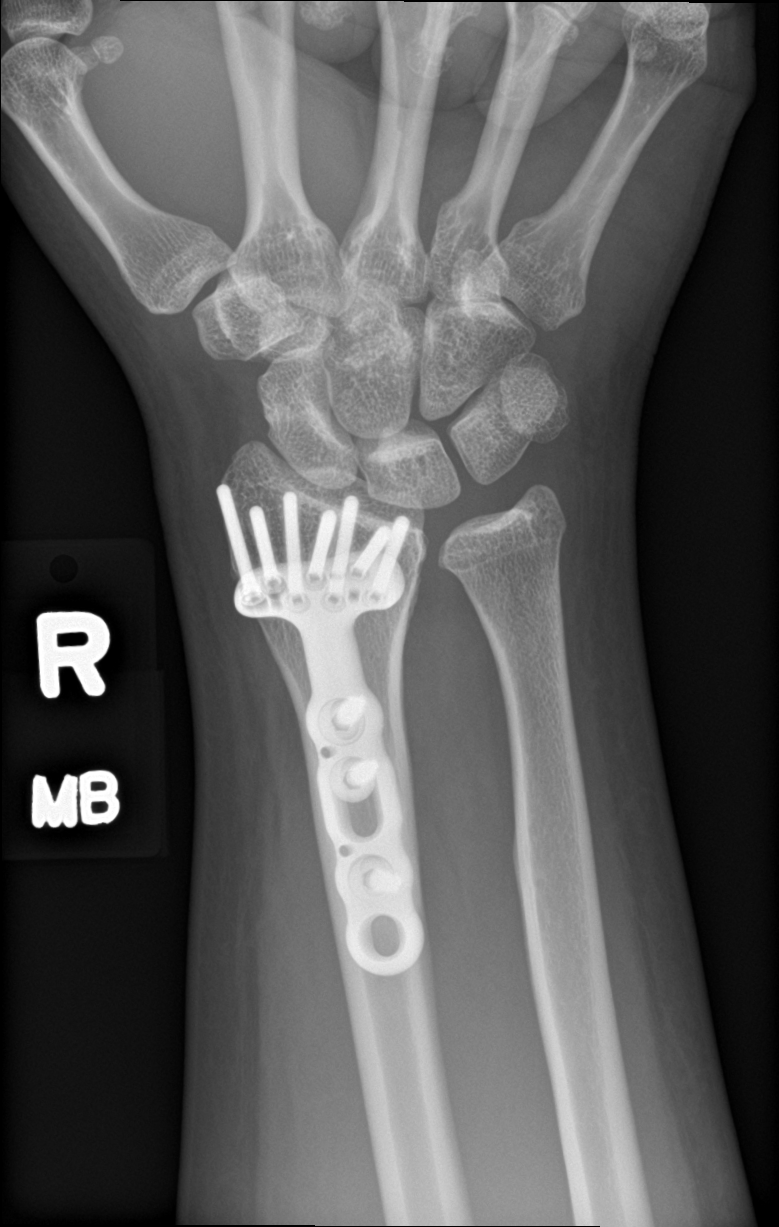

[wrist navicular]
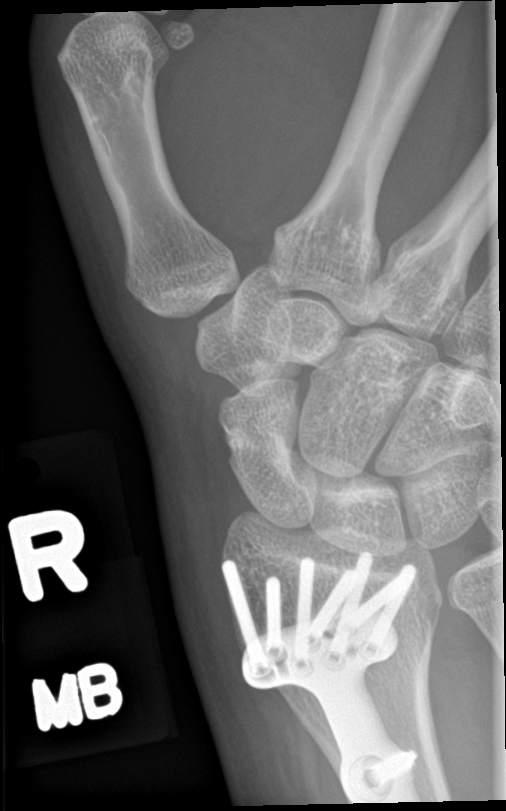

[wrist obl]
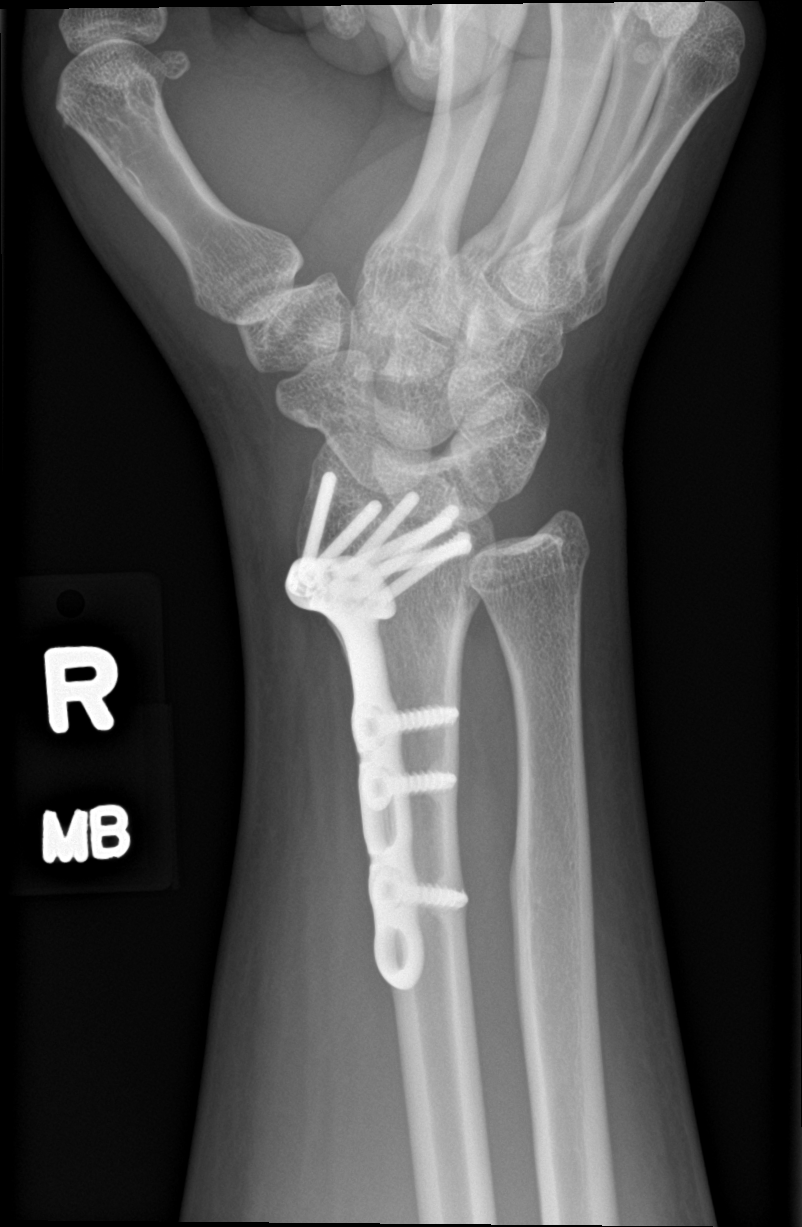

[wrist lat]
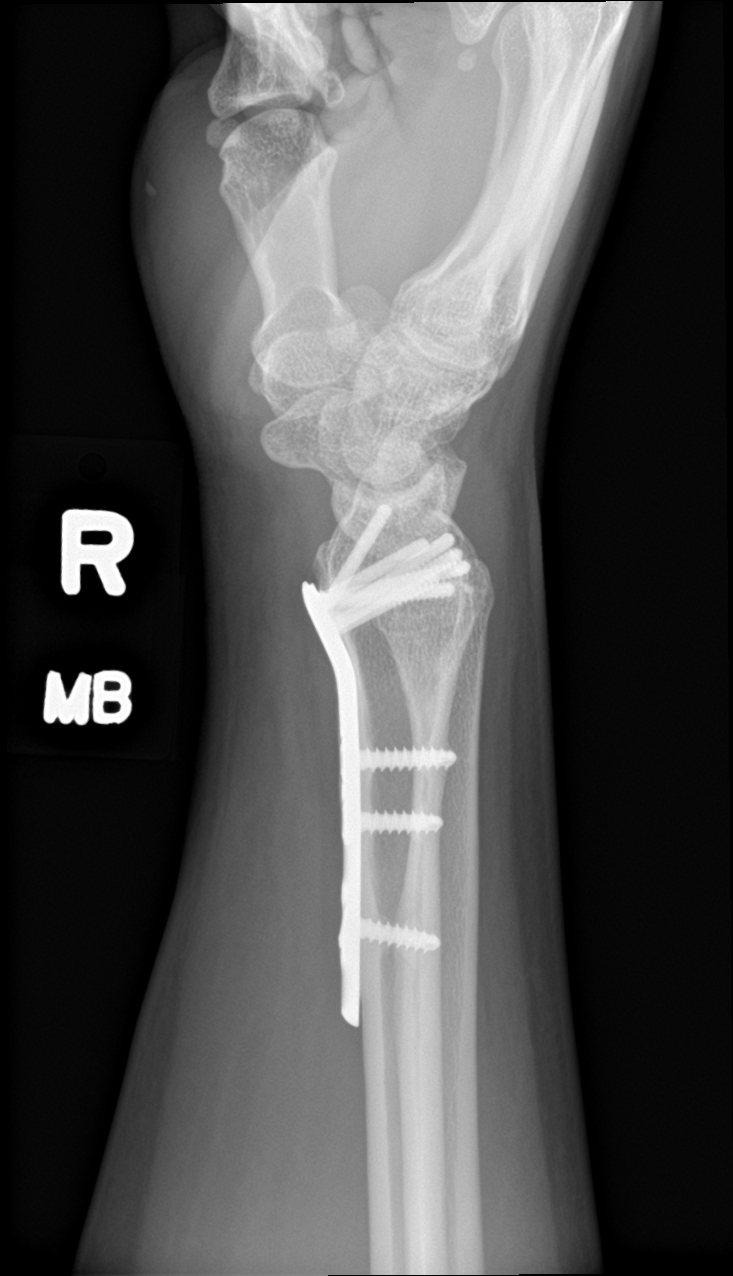

[4 of 4 positions shown; findings below may reference images not displayed]

FINDINGS: Ventral ORIF of the distal radius is intact. No acute bone or soft
tissue abnormalities are present. Carpal bones are intact.
IMPRESSION: No acute abnormality.

## 2019-11-29 IMAGING — US US MFM FETAL BPP W/O NON-STRESS
1 series · 16 of 24 positions shown · non-contrast
Comparison: none

[Series 1: us mfm fetal bpp w/o non-stress · 24 acquisitions, 16 frames shown]
[im 1/24]
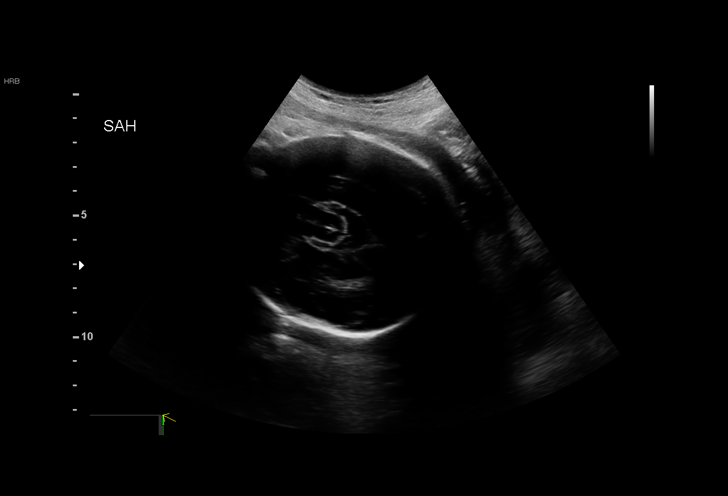
[im 3/24]
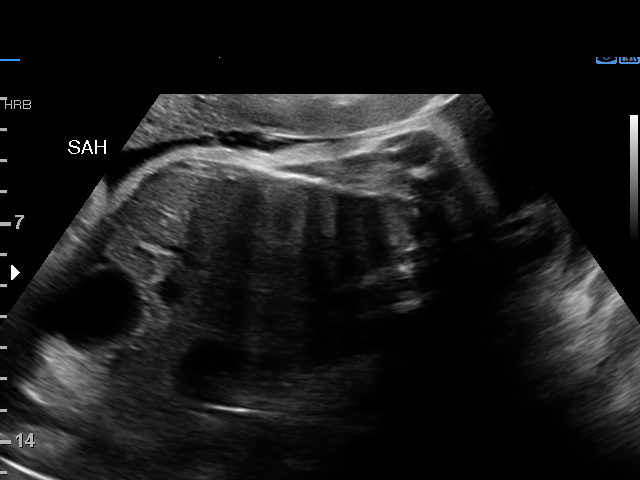
[im 4/24]
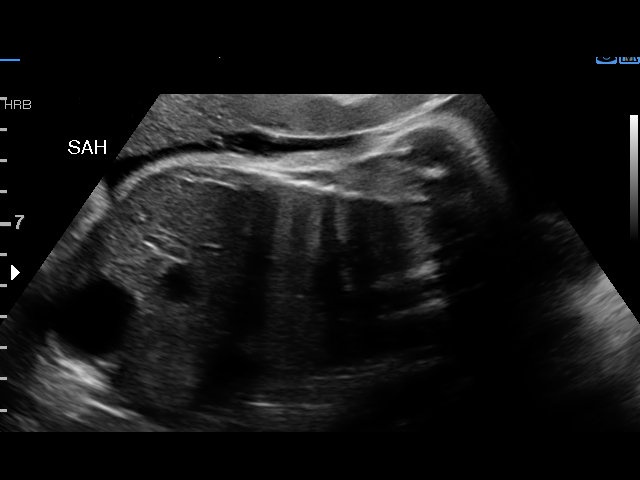
[im 6/24]
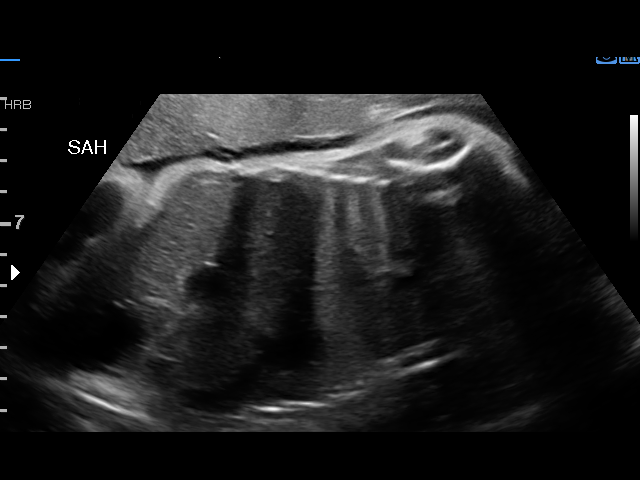
[im 7/24]
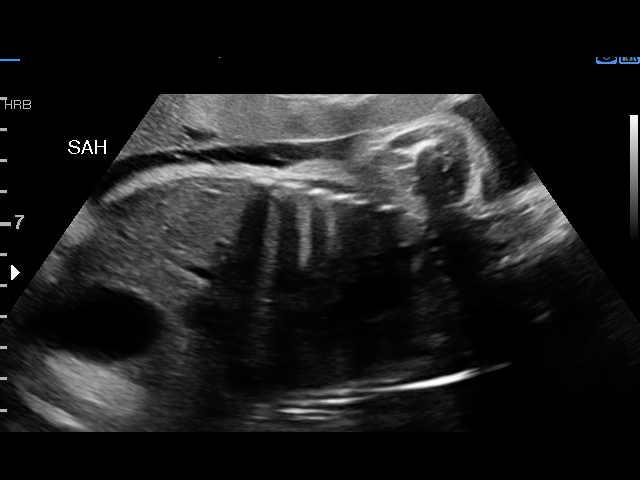
[im 9/24]
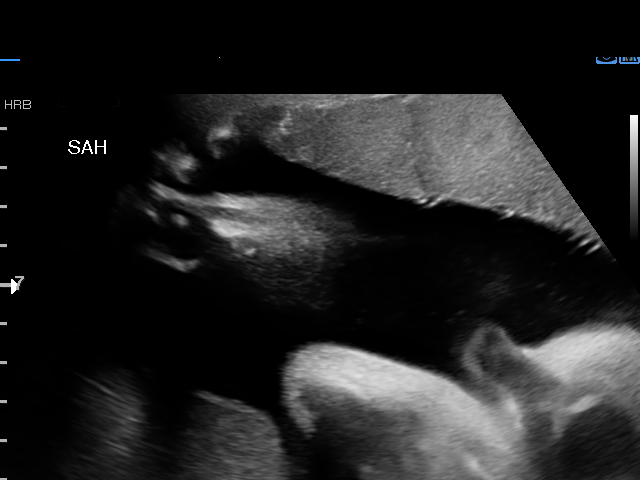
[im 10/24]
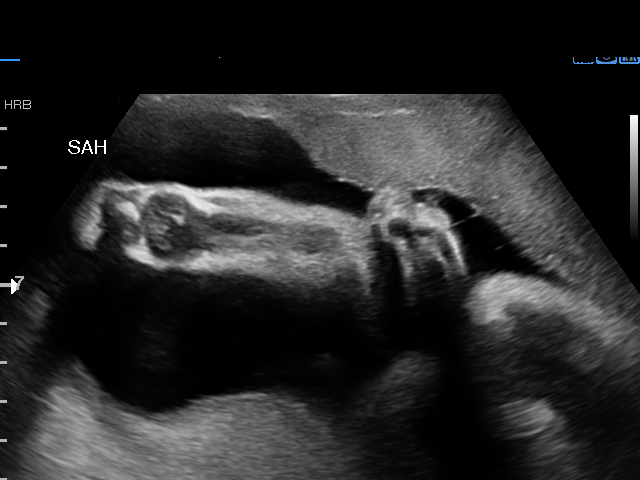
[im 12/24]
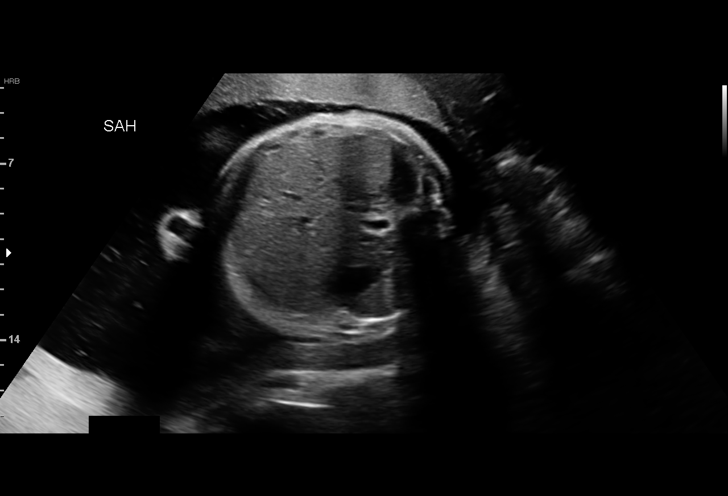
[im 13/24]
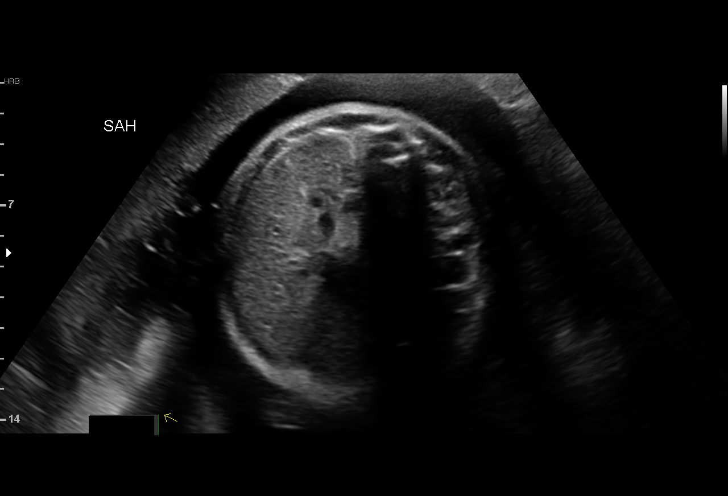
[im 15/24]
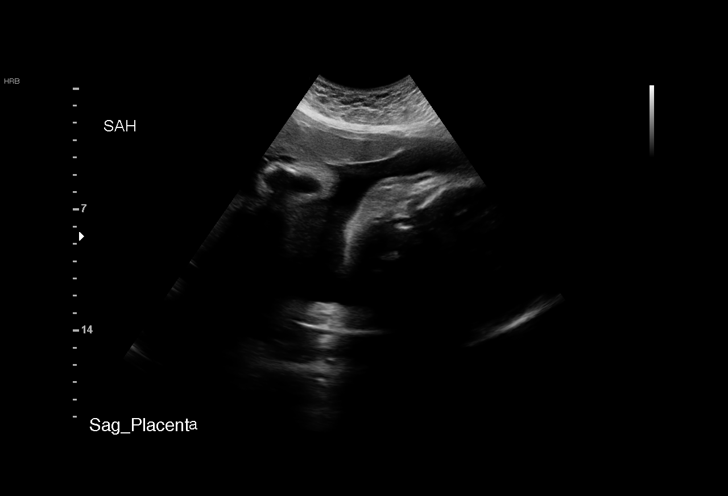
[im 16/24]
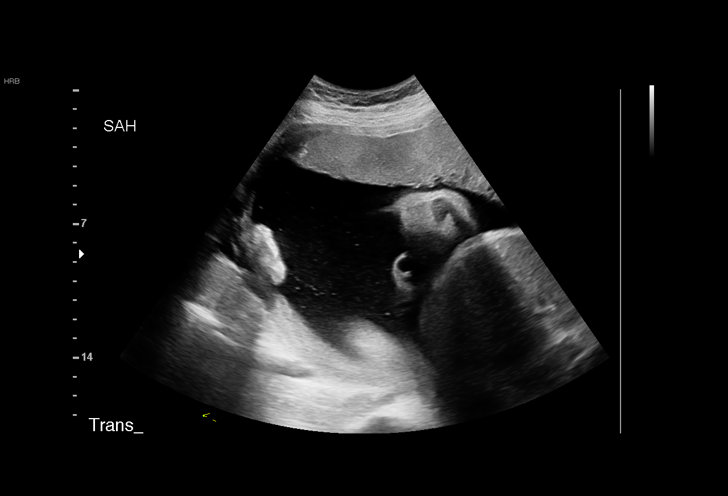
[im 18/24]
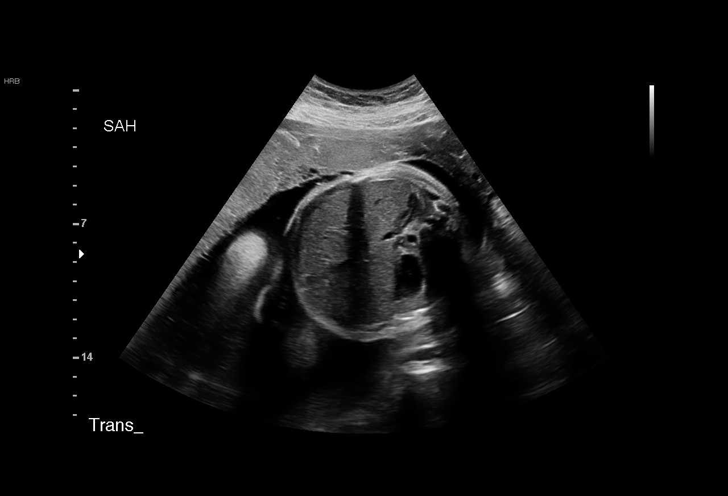
[im 19/24]
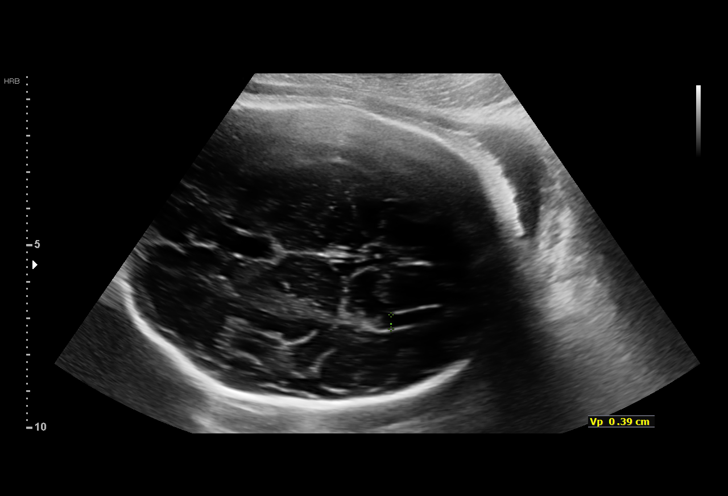
[im 21/24]
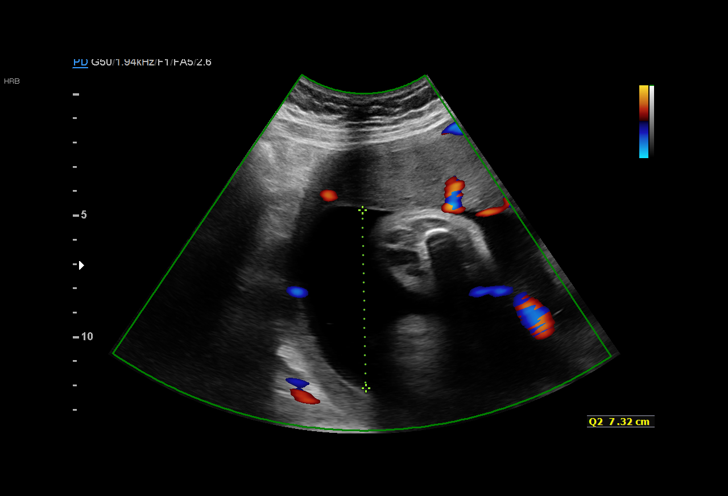
[im 22/24]
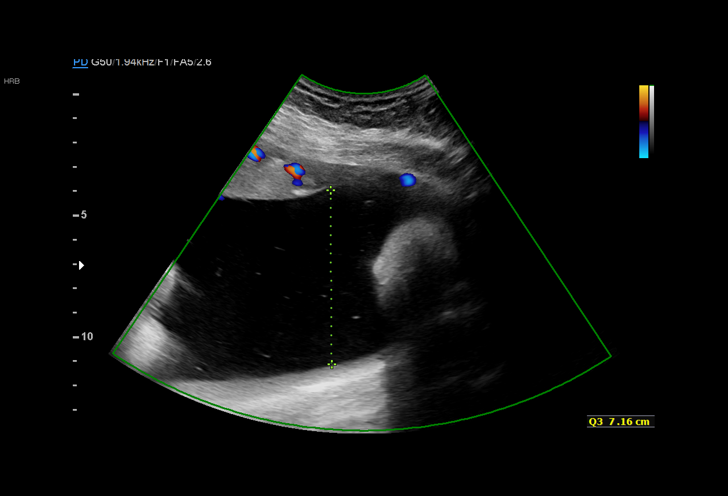
[im 24/24]
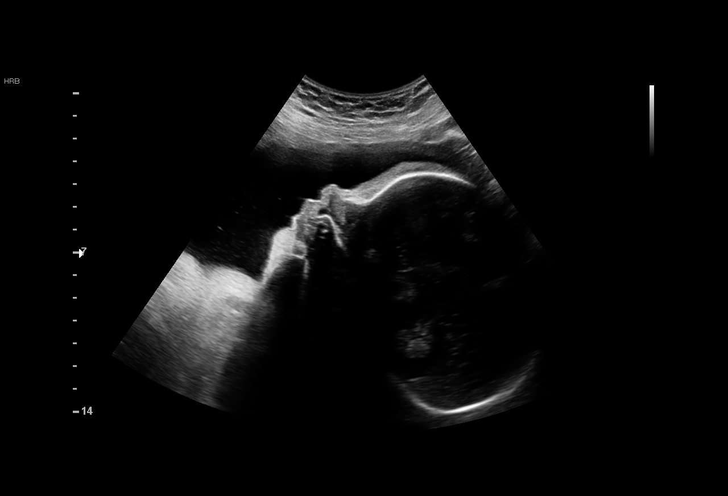

[16 of 24 positions shown; findings below may reference images not displayed]

OB

 ----------------------------------------------------------------------

 ----------------------------------------------------------------------
Indications

  33 weeks gestation of pregnancy
  Polyhydramnios, third trimester, antepartum
  condition or complication, unspecified fetus
 ----------------------------------------------------------------------
Vital Signs

 BMI:
Fetal Evaluation

 Num Of Fetuses:          1
 Fetal Heart Rate(bpm):   159
 Cardiac Activity:        Observed
 Presentation:            Cephalic
 Placenta:                Anterior

 Amniotic Fluid
 AFI FV:      Polyhydramnios

 AFI Sum(cm)     %Tile       Largest Pocket(cm)
 25.08           95

 RUQ(cm)       RLQ(cm)       LUQ(cm)        LLQ(cm)

Biophysical Evaluation
 Amniotic F.V:   Polyhydramnios             F. Tone:         Observed
 F. Movement:    Observed                   Score:           [DATE]
 F. Breathing:   Observed
Biometry

 LV:        3.9  mm
Gestational Age

 Best:          33w 0d     Det. By:  Previous Ultrasound      EDD:   04/14/18
Anatomy

 Thoracic:              Appears normal         Bladder:                Appears normal
 Stomach:               Appears normal, left
                        sided
Cervix Uterus Adnexa

 Cervix
 Not visualized (advanced GA >81wks)
Impression

 Normal interval growth.
 Polyhydramnios
Recommendations

 Repeat growth and fluid in 3-4 weeks.

## 2020-04-25 ENCOUNTER — Encounter: Payer: Self-pay | Admitting: Internal Medicine

## 2020-10-19 ENCOUNTER — Encounter: Payer: BLUE CROSS/BLUE SHIELD | Admitting: Family Medicine

## 2021-06-28 ENCOUNTER — Other Ambulatory Visit: Payer: Self-pay

## 2021-06-28 ENCOUNTER — Emergency Department (HOSPITAL_COMMUNITY): Payer: BLUE CROSS/BLUE SHIELD

## 2021-06-28 ENCOUNTER — Emergency Department (HOSPITAL_COMMUNITY)
Admission: EM | Admit: 2021-06-28 | Discharge: 2021-06-28 | Disposition: A | Discharge status: Home / Self Care | Payer: BLUE CROSS/BLUE SHIELD | Attending: Emergency Medicine | Admitting: Emergency Medicine

## 2021-06-28 ENCOUNTER — Encounter (HOSPITAL_COMMUNITY): Payer: Self-pay

## 2021-06-28 DIAGNOSIS — Y92410 Unspecified street and highway as the place of occurrence of the external cause: Secondary | ICD-10-CM | POA: Diagnosis not present

## 2021-06-28 DIAGNOSIS — M542 Cervicalgia: Secondary | ICD-10-CM | POA: Diagnosis not present

## 2021-06-28 DIAGNOSIS — R519 Headache, unspecified: Secondary | ICD-10-CM | POA: Diagnosis present

## 2021-06-28 NOTE — ED Notes (Signed)
RN reviewed discharge instructions w/ pt. Follow up and pain management reviewed, pt had no further questions. 
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
## 2021-06-28 NOTE — Discharge Instructions (Signed)
Take ibuprofen and Tylenol for headache pain and muscle soreness.  Avoid activities that symptoms.  Avoid any head injuries while healing from concussion.
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
## 2021-11-29 ENCOUNTER — Encounter: Payer: Self-pay | Admitting: Internal Medicine
